# Patient Record
Sex: Female | Born: 1945
Health system: Southern US, Community
[De-identification: ages and names within clinical notes are randomized; demographics above are authoritative.]

## PROBLEM LIST (undated history)

## (undated) DIAGNOSIS — I10 Essential (primary) hypertension: Secondary | ICD-10-CM

## (undated) DIAGNOSIS — M1990 Unspecified osteoarthritis, unspecified site: Secondary | ICD-10-CM

## (undated) DIAGNOSIS — K5790 Diverticulosis of intestine, part unspecified, without perforation or abscess without bleeding: Secondary | ICD-10-CM

## (undated) DIAGNOSIS — K219 Gastro-esophageal reflux disease without esophagitis: Secondary | ICD-10-CM

## (undated) DIAGNOSIS — T7840XA Allergy, unspecified, initial encounter: Secondary | ICD-10-CM

## (undated) DIAGNOSIS — K648 Other hemorrhoids: Secondary | ICD-10-CM

## (undated) DIAGNOSIS — D126 Benign neoplasm of colon, unspecified: Secondary | ICD-10-CM

## (undated) DIAGNOSIS — M8580 Other specified disorders of bone density and structure, unspecified site: Secondary | ICD-10-CM

## (undated) HISTORY — PX: TONSILLECTOMY AND ADENOIDECTOMY: SUR1326

## (undated) HISTORY — DX: Allergy, unspecified, initial encounter: T78.40XA

## (undated) HISTORY — DX: Benign neoplasm of colon, unspecified: D12.6

## (undated) HISTORY — DX: Diverticulosis of intestine, part unspecified, without perforation or abscess without bleeding: K57.90

## (undated) HISTORY — PX: ROTATOR CUFF REPAIR: SHX139

## (undated) HISTORY — DX: Essential (primary) hypertension: I10

## (undated) HISTORY — DX: Gastro-esophageal reflux disease without esophagitis: K21.9

## (undated) HISTORY — DX: Unspecified osteoarthritis, unspecified site: M19.90

## (undated) HISTORY — DX: Other hemorrhoids: K64.8

## (undated) HISTORY — PX: APPENDECTOMY: SHX54

## (undated) HISTORY — DX: Other specified disorders of bone density and structure, unspecified site: M85.80

---

## 1999-05-04 ENCOUNTER — Ambulatory Visit (HOSPITAL_COMMUNITY): Admission: RE | Admit: 1999-05-04 | Discharge: 1999-05-04 | Payer: Self-pay | Admitting: Gastroenterology

## 1999-12-12 ENCOUNTER — Encounter: Payer: Self-pay | Admitting: Internal Medicine

## 1999-12-12 ENCOUNTER — Encounter: Admission: RE | Admit: 1999-12-12 | Discharge: 1999-12-12 | Payer: Self-pay | Admitting: Internal Medicine

## 2000-09-30 ENCOUNTER — Other Ambulatory Visit: Admission: RE | Admit: 2000-09-30 | Discharge: 2000-09-30 | Payer: Self-pay | Admitting: *Deleted

## 2001-01-01 ENCOUNTER — Encounter: Payer: Self-pay | Admitting: *Deleted

## 2001-01-01 ENCOUNTER — Encounter: Admission: RE | Admit: 2001-01-01 | Discharge: 2001-01-01 | Payer: Self-pay | Admitting: *Deleted

## 2002-01-14 ENCOUNTER — Encounter: Admission: RE | Admit: 2002-01-14 | Discharge: 2002-01-14 | Payer: Self-pay | Admitting: Internal Medicine

## 2002-01-14 ENCOUNTER — Encounter: Payer: Self-pay | Admitting: Internal Medicine

## 2002-03-05 ENCOUNTER — Other Ambulatory Visit: Admission: RE | Admit: 2002-03-05 | Discharge: 2002-03-05 | Payer: Self-pay | Admitting: *Deleted

## 2003-01-28 ENCOUNTER — Encounter: Payer: Self-pay | Admitting: Internal Medicine

## 2003-01-28 ENCOUNTER — Ambulatory Visit (HOSPITAL_COMMUNITY): Admission: RE | Admit: 2003-01-28 | Discharge: 2003-01-28 | Payer: Self-pay | Admitting: Internal Medicine

## 2004-03-14 ENCOUNTER — Ambulatory Visit (HOSPITAL_COMMUNITY): Admission: RE | Admit: 2004-03-14 | Discharge: 2004-03-14 | Payer: Self-pay | Admitting: Internal Medicine

## 2005-02-26 ENCOUNTER — Encounter: Admission: RE | Admit: 2005-02-26 | Discharge: 2005-02-26 | Payer: Self-pay | Admitting: Internal Medicine

## 2005-05-08 ENCOUNTER — Ambulatory Visit (HOSPITAL_COMMUNITY): Admission: RE | Admit: 2005-05-08 | Discharge: 2005-05-08 | Payer: Self-pay | Admitting: Internal Medicine

## 2006-05-21 ENCOUNTER — Ambulatory Visit (HOSPITAL_COMMUNITY): Admission: RE | Admit: 2006-05-21 | Discharge: 2006-05-21 | Payer: Self-pay | Admitting: Internal Medicine

## 2007-06-26 ENCOUNTER — Ambulatory Visit (HOSPITAL_COMMUNITY): Admission: RE | Admit: 2007-06-26 | Discharge: 2007-06-26 | Payer: Self-pay | Admitting: Internal Medicine

## 2007-07-01 ENCOUNTER — Encounter: Admission: RE | Admit: 2007-07-01 | Discharge: 2007-07-01 | Payer: Self-pay | Admitting: Internal Medicine

## 2008-05-11 ENCOUNTER — Ambulatory Visit (HOSPITAL_BASED_OUTPATIENT_CLINIC_OR_DEPARTMENT_OTHER): Admission: RE | Admit: 2008-05-11 | Discharge: 2008-05-12 | Payer: Self-pay | Admitting: Orthopedic Surgery

## 2008-07-12 ENCOUNTER — Ambulatory Visit (HOSPITAL_COMMUNITY): Admission: RE | Admit: 2008-07-12 | Discharge: 2008-07-12 | Payer: Self-pay | Admitting: Family Medicine

## 2008-08-23 ENCOUNTER — Ambulatory Visit (HOSPITAL_COMMUNITY): Admission: RE | Admit: 2008-08-23 | Discharge: 2008-08-23 | Payer: Self-pay | Admitting: Internal Medicine

## 2009-09-01 ENCOUNTER — Ambulatory Visit (HOSPITAL_COMMUNITY): Admission: RE | Admit: 2009-09-01 | Discharge: 2009-09-01 | Payer: Self-pay | Admitting: Internal Medicine

## 2010-12-16 ENCOUNTER — Encounter: Payer: Self-pay | Admitting: Internal Medicine

## 2011-04-10 NOTE — Op Note (Signed)
NAMEPRINCESSA, LESMEISTER                ACCOUNT NO.:  [id number]   MEDICAL RECORD NO.:  [id number]          PATIENT TYPE:  AMB   LOCATION:  [facility]                         FACILITY:  [facility]   PHYSICIAN:  Deidre Ala, M.D.    DATE OF BIRTH:  Jul 27, 1946   DATE OF PROCEDURE:  05/11/2008  DATE OF DISCHARGE:                               OPERATIVE REPORT   PREOPERATIVE DIAGNOSES:  1. Incomplete avulsion of subscapularis tendon, anterior right      shoulder closed.  2. Subluxation of the long head biceps tendon.   POSTOPERATIVE DIAGNOSES:  1. Incomplete avulsion of subscapularis tendon, anterior right      shoulder closed.  2. Subluxation of the long head biceps tendon.   PROCEDURE:  1. Open reduction of subscapularis rotator cuff anteriorly with use of      3 Bio-Corkscrew anchors 5.5.  2. A long head biceps tenodesis using push-lock devices Arthrex x3.   SURGEON:  Doristine Section.   ASSISTANT:  Phineas Semen, P.A.-C.   ANESTHESIA:  General endotracheal with scalene block.   CULTURES:  None.   DRAINS:  None.   BLOOD LOSS:  Less than 100 mL, replaced without.   PATHOLOGIC FINDINGS AND HISTORY:  Charlotte Williams is a 65 year old who had a low-  velocity injury producing shoulder pain.  The MRI was positive for the  above findings.  We discussed it and felt that this would produce a  significant deficit for her with a recurring clicking of the biceps  tendon.  At surgery, we did find complete subluxation of the biceps  tendon with hypertrophic synovium within the bicipital groove.  There  was synovium that was hypertrophied in the inner capsule.  The labrum  was intact as well as the glenohumeral surface.  The biceps rupture was  ___________ and we were able to settle it down nicely into the footprint  just medial to the lesser tuberosity with 3 bio-Corkscrew anchors with 6  total sutures taken through with horizontal mattress sutures down the  line and then brought over top of the lesser  tuberosity into the groove,  and using the sutures from front to back down with push locks x3, each  with two sutures in them with four strands with the Arthrex push locks,  this tenodesed the biceps into the inner tubercular groove and flattened  down the knots and flattened down the repair of the subscapularis into  the footprint.  We also put two stitches of #2 Ethilon in the more  distal rotator cuff interval for complete closure.   PROCEDURE:  With adequate anesthesia obtained using general endotracheal  and scalene block, the patient was placed in supine beach chair  position.  Skin markings were made for anatomic positioning, and  standard prepping and draping had been carried out prior to this.  We  then made an incision in the deltopectoral groove.  Incision was  deepened sharply with a knife and hemostasis obtained using Bovie  electrocoagulator.  Under loupe magnification with headlamp, dissection  was carried down through the subcutaneous tissue into the deltopectoral  interval.  We then identified and protected the cephalic vein.  We then  dissected down into the precoracoid fascia and released the lateral  conjoined tendon to gain better access.  Deep link retractors were then  placed exposing the anterior joint with visible some fluid coming from  the area that was some murky but obviously not infected from the area of  the retracted subscapularis off its bed just medial to the lesser  tuberosity.  The biceps tendon was seen to be subluxed.  We then removed  sharply hypertrophic synovium within the groove and in the joint  posterior to the subscapularis, checked the labrum.  I then cut the long  head of the biceps at the superior labrum and tagged it so it would not  retract.  I then placed 3 bio-Corkscrew anchors from top to bottom along  the lesser tuberosity, 5.5, with the 2 FiberWire sutures #2 each,  brought them underneath, and then through with horizontal mattress   technique and then sutured each suture down for a total 6 horizontal  mattress sutures reconstituting the subscapularis back to its footprint  for the rupture.  I then took the knots and placed them from anterior to  posterior, and then the upper 2, middle 2 and the lower 2 each had the  suture knots placed into the groove using push locks, thus completing  biceps tenodesis as well as flattening down the knots and applying the  sutures against the lesser tuberosity as one would do in a rotator cuff  repair.  We then repaired the rotator cuff interval as listed.  Thorough  irrigation was carried out.  A second gram of Ancef was given later in  the case due to the length of the procedure.  I then closed the release  of the lateral conjoined tendon with #1 Ethibond and then closed the  deep subcutaneous with a running 2-0 Vicryl, superficial 3-0 Vicryl and  running 3-0 Monocryl with Steri-Strips.  Bulky sterile compressive  dressing was applied with a sling.  The patient having tolerated the  procedure well was awakened, taken to recovery room in satisfactory  condition, to be discharged per outpatient routine, given Percocet for  pain and told call the office for appointment for recheck on Friday or  Saturday.           ______________________________  V. Charlesetta Shanks, M.D.     VEP/MEDQ  D:  05/11/2008  T:  05/11/2008  Job:  [id number]   cc:   Deidre Ala, M.D.  Fax: [phone]   Theressa Millard, M.D.  Fax: [phone]

## 2011-08-23 LAB — POCT HEMOGLOBIN-HEMACUE: Operator id: 114531

## 2012-05-08 ENCOUNTER — Telehealth: Payer: Self-pay

## 2012-05-08 NOTE — Telephone Encounter (Signed)
PT STATES SHE IS FILLING OUT A [employer] INSURANCE AND NEED TO KNOW WHEN DID SHE HAVE BRONCHITIS AND SINUSES PLEASE CALL [phone]. PT WAS TOLD IT COULD TAKE UP TO 48HRS.

## 2016-07-19 LAB — PULMONARY FUNCTION TEST

## 2017-08-16 ENCOUNTER — Ambulatory Visit: Payer: Self-pay | Admitting: Family Medicine

## 2017-08-17 ENCOUNTER — Ambulatory Visit (INDEPENDENT_AMBULATORY_CARE_PROVIDER_SITE_OTHER): Payer: Medicare PPO | Admitting: Physician Assistant

## 2017-08-17 ENCOUNTER — Encounter: Payer: Self-pay | Admitting: Physician Assistant

## 2017-08-17 VITALS — BP 144/91 | HR 95 | Temp 98.1°F | Resp 17 | Ht 62.0 in | Wt 109.0 lb

## 2017-08-17 DIAGNOSIS — K529 Noninfective gastroenteritis and colitis, unspecified: Secondary | ICD-10-CM | POA: Diagnosis not present

## 2017-08-17 DIAGNOSIS — R1084 Generalized abdominal pain: Secondary | ICD-10-CM | POA: Diagnosis not present

## 2017-08-17 LAB — POCT CBC
Granulocyte percent: 67.3 %G (ref 37–80)
HCT, POC: 44.1 % (ref 37.7–47.9)
Hemoglobin: 15.2 g/dL (ref 12.2–16.2)
Lymph, poc: 2.4 (ref 0.6–3.4)
MCH, POC: 31.6 pg — AB (ref 27–31.2)
MCHC: 34.4 g/dL (ref 31.8–35.4)
MCV: 91.9 fL (ref 80–97)
MID (cbc): 0.6 (ref 0–0.9)
MPV: 7 fL (ref 0–99.8)
POC Granulocyte: 6.1 (ref 2–6.9)
POC LYMPH PERCENT: 25.9 %L (ref 10–50)
POC MID %: 6.8 % (ref 0–12)
Platelet Count, POC: 284 10*3/uL (ref 142–424)
RBC: 4.8 M/uL (ref 4.04–5.48)
RDW, POC: 12.4 %
WBC: 9.1 10*3/uL (ref 4.6–10.2)

## 2017-08-17 LAB — POCT URINALYSIS DIP (MANUAL ENTRY)
Glucose, UA: NEGATIVE mg/dL
Leukocytes, UA: NEGATIVE
Nitrite, UA: NEGATIVE
Protein Ur, POC: 30 mg/dL — AB
Spec Grav, UA: 1.01 (ref 1.010–1.025)
Urobilinogen, UA: 0.2 E.U./dL
pH, UA: 7 (ref 5.0–8.0)

## 2017-08-17 LAB — POC MICROSCOPIC URINALYSIS (UMFC): Mucus: ABSENT

## 2017-08-17 NOTE — Progress Notes (Signed)
Charlotte Williams  MRN: [id number] DOB: 1946/06/03  PCP: No primary care provider on file.  Subjective:  Pt is a 71 year old female who presents to clinic for abdominal pain x 3 days. Endorses vomiting and not able to keep food down.  4 episodes of vomiting three days ago; 2 episodes the next day; none since that time. She has had a few episodes of diarrhea over the past few days. Diarrhea is "kind of watery to normal". She endorses crampy diffuse abdominal pain. No blood in stool or vomit. Denies fever, chills.  She has been able to eat 2 bananas, a few bites of soup, some bread, water, orange juice, coffee.  She went out to eat with a friend five days ago. Her symptoms started the next day. No travel.   Review of Systems  Constitutional: Negative for chills, diaphoresis, fatigue and fever.  Gastrointestinal: Positive for abdominal pain, diarrhea, nausea and vomiting. Negative for blood in stool and constipation.  Neurological: Negative for weakness, light-headedness and headaches.  Psychiatric/Behavioral: Negative for sleep disturbance.    There are no active problems to display for this patient.   No current outpatient prescriptions on file prior to visit.   No current facility-administered medications on file prior to visit.     Allergies  Allergen Reactions  . Codeine Itching  . Sulfa Antibiotics Itching     Objective:  BP (!) 144/91 (BP Location: Right Arm, Patient Position: Sitting, Cuff Size: Normal)   Pulse 95   Temp 98.1 F (36.7 C) (Oral)   Resp 17   Ht '5\' 2"'  (1.575 m)   Wt 109 lb (49.4 kg)   SpO2 99%   BMI 19.94 kg/m   Physical Exam  Constitutional: She is oriented to person, place, and time and well-developed, well-nourished, and in no distress. No distress.  Cardiovascular: Normal rate, regular rhythm and normal heart sounds.   Abdominal: Soft. Normal appearance and bowel sounds are normal. There is generalized tenderness. There is no rigidity, no rebound  and no guarding.  Neurological: She is alert and oriented to person, place, and time. GCS score is 15.  Skin: Skin is warm and dry.  Psychiatric: Mood, memory, affect and judgment normal.  Vitals reviewed.  Results for orders placed or performed in visit on 08/17/17  POCT CBC  Result Value Ref Range   WBC 9.1 4.6 - 10.2 K/uL   Lymph, poc 2.4 0.6 - 3.4   POC LYMPH PERCENT 25.9 10 - 50 %L   MID (cbc) 0.6 0 - 0.9   POC MID % 6.8 0 - 12 %M   POC Granulocyte 6.1 2 - 6.9   Granulocyte percent 67.3 37 - 80 %G   RBC 4.80 4.04 - 5.48 M/uL   Hemoglobin 15.2 12.2 - 16.2 g/dL   HCT, POC 44.1 37.7 - 47.9 %   MCV 91.9 80 - 97 fL   MCH, POC 31.6 (A) 27 - 31.2 pg   MCHC 34.4 31.8 - 35.4 g/dL   RDW, POC 12.4 %   Platelet Count, POC 284 142 - 424 K/uL   MPV 7.0 0 - 99.8 fL  POCT urinalysis dipstick  Result Value Ref Range   Color, UA yellow yellow   Clarity, UA clear clear   Glucose, UA negative negative mg/dL   Bilirubin, UA small (A) negative   Ketones, POC UA trace (5) (A) negative mg/dL   Spec Grav, UA 1.010 1.010 - 1.025   Blood, UA moderate (A)  negative   pH, UA 7.0 5.0 - 8.0   Protein Ur, POC =30 (A) negative mg/dL   Urobilinogen, UA 0.2 0.2 or 1.0 E.U./dL   Nitrite, UA Negative Negative   Leukocytes, UA Negative Negative  POCT Microscopic Urinalysis ([facility])  Result Value Ref Range   WBC,UR,HPF,POC None None WBC/hpf   RBC,UR,HPF,POC Few (A) None RBC/hpf   Bacteria None None, Too numerous to count   Mucus Absent Absent   Epithelial Cells, UR Per Microscopy Few (A) None, Too numerous to count cells/hpf    Assessment and Plan :  1. Generalized abdominal pain 2. Gastroenteritis - POCT CBC - CMP14+EGFR - POCT urinalysis dipstick - POCT Microscopic Urinalysis ([facility]) - Labs are negative. Vital signs are wnl. Her symptoms are improving. Suspect gastroenteritis. Advised bland diet and hydration until symptoms resolve. RTC if she fails to improve/symptoms worsen. She understands and  agrees with plan.   Mercer Pod, PA-C  [facility] [place] 08/17/2017 8:45 AM

## 2017-08-17 NOTE — Patient Instructions (Addendum)
Start following the bland diet (below). Stay well hydrated!! Drink 32-64 oz water and/or clear fluids daily. Follow this diet until your symptoms improve. You will be feeling an upset stomach for about 1 week.  Come back if you are not better in 5 days.    Bland Diet A bland diet consists of foods that do not have a lot of fat or fiber. Foods without fat or fiber are easier for the body to digest. They are also less likely to irritate your mouth, throat, stomach, and other parts of your gastrointestinal tract. A bland diet is sometimes called a BRAT diet. What is my plan? Your health care provider or dietitian may recommend specific changes to your diet to prevent and treat your symptoms, such as:  Eating small meals often.  Cooking food until it is soft enough to chew easily.  Chewing your food well.  Drinking fluids slowly.  Not eating foods that are very spicy, sour, or fatty.  Not eating citrus fruits, such as oranges and grapefruit.  What do I need to know about this diet?  Eat a variety of foods from the bland diet food list.  Do not follow a bland diet longer than you have to.  Ask your health care provider whether you should take vitamins. What foods can I eat? Grains  Hot cereals, such as cream of wheat. Bread, crackers, or tortillas made from refined white flour. Rice. Vegetables Canned or cooked vegetables. Mashed or boiled potatoes. Fruits Bananas. Applesauce. Other types of cooked or canned fruit with the skin and seeds removed, such as canned peaches or pears. Meats and Other Protein Sources Scrambled eggs. Creamy peanut butter or other nut butters. Lean, well-cooked meats, such as chicken or fish. Tofu. Soups or broths. Dairy Low-fat dairy products, such as milk, cottage cheese, or yogurt. Beverages Water. Herbal tea. Apple juice. Sweets and Desserts Pudding. Custard. Fruit gelatin. Ice cream. Fats and Oils Mild salad dressings. Canola or olive oil. The  items listed above may not be a complete list of allowed foods or beverages. Contact your dietitian for more options. What foods are not recommended? Foods and ingredients that are often not recommended include:  Spicy foods, such as hot sauce or salsa.  Fried foods.  Sour foods, such as pickled or fermented foods.  Raw vegetables or fruits, especially citrus or berries.  Caffeinated drinks.  Alcohol.  Strongly flavored seasonings or condiments.  The items listed above may not be a complete list of foods and beverages that are not allowed. Contact your dietitian for more information. This information is not intended to replace advice given to you by your health care provider. Make sure you discuss any questions you have with your health care provider. Document Released: 03/05/2016 Document Revised: 04/19/2016 Document Reviewed: 11/24/2014 Elsevier Interactive Patient Education  2018 [employer].   IF you received an x-ray today, you will receive an invoice from [facility]. Please contact [facility] at [phone] with questions or concerns regarding your invoice.   IF you received labwork today, you will receive an invoice from [place]. Please contact [facility] at [phone] with questions or concerns regarding your invoice.   Our billing staff will not be able to assist you with questions regarding bills from these companies.  You will be contacted with the lab results as soon as they are available. The fastest way to get your results is to activate your My Chart account. Instructions are located on the last page of this paperwork.  If you have not heard from [place] regarding the results in 2 weeks, please contact this office.

## 2017-08-18 LAB — CMP14+EGFR
ALT: 22 IU/L (ref 0–32)
AST: 28 IU/L (ref 0–40)
Albumin/Globulin Ratio: 2.8 — ABNORMAL HIGH (ref 1.2–2.2)
Albumin: 5.1 g/dL — ABNORMAL HIGH (ref 3.6–4.8)
Alkaline Phosphatase: 46 IU/L (ref 39–117)
BUN/Creatinine Ratio: 9 — ABNORMAL LOW (ref 12–28)
BUN: 6 mg/dL — ABNORMAL LOW (ref 8–27)
Bilirubin Total: 0.4 mg/dL (ref 0.0–1.2)
CO2: 25 mmol/L (ref 20–29)
Calcium: 9.7 mg/dL (ref 8.7–10.3)
Chloride: 90 mmol/L — ABNORMAL LOW (ref 96–106)
Creatinine, Ser: 0.66 mg/dL (ref 0.57–1.00)
GFR calc Af Amer: 104 mL/min/{1.73_m2} (ref >59)
GFR calc non Af Amer: 90 mL/min/{1.73_m2} (ref >59)
Globulin, Total: 1.8 g/dL (ref 1.5–4.5)
Glucose: 88 mg/dL (ref 65–99)
Potassium: 3.8 mmol/L (ref 3.5–5.2)
Sodium: 132 mmol/L — ABNORMAL LOW (ref 134–144)
Total Protein: 6.9 g/dL (ref 6.0–8.5)

## 2017-08-20 ENCOUNTER — Ambulatory Visit (INDEPENDENT_AMBULATORY_CARE_PROVIDER_SITE_OTHER): Payer: Medicare PPO | Admitting: Physician Assistant

## 2017-08-20 ENCOUNTER — Encounter: Payer: Self-pay | Admitting: Physician Assistant

## 2017-08-20 VITALS — BP 121/85 | HR 71 | Temp 98.4°F | Resp 18 | Ht 62.0 in | Wt 110.6 lb

## 2017-08-20 DIAGNOSIS — Z23 Encounter for immunization: Secondary | ICD-10-CM

## 2017-08-20 DIAGNOSIS — G4700 Insomnia, unspecified: Secondary | ICD-10-CM | POA: Insufficient documentation

## 2017-08-20 DIAGNOSIS — H43391 Other vitreous opacities, right eye: Secondary | ICD-10-CM | POA: Diagnosis not present

## 2017-08-20 DIAGNOSIS — H2513 Age-related nuclear cataract, bilateral: Secondary | ICD-10-CM | POA: Diagnosis not present

## 2017-08-20 DIAGNOSIS — R1011 Right upper quadrant pain: Secondary | ICD-10-CM | POA: Diagnosis not present

## 2017-08-20 MED ORDER — LORAZEPAM 0.5 MG PO TABS: 0.5000 mg | ORAL_TABLET | Freq: Every evening | ORAL | 0 refills | Status: DC | PRN

## 2017-08-20 NOTE — Patient Instructions (Addendum)
We recommend that you schedule a mammogram for breast cancer screening. Typically, you do not need a referral to do this. Please contact a local imaging center to schedule your mammogram.  [facility] - [phone]  *ask for the Radiology Department [facility] - [phone] or [phone]  [facility] - [phone] [facility] - [phone] [facility] - [phone]  *ask for the Radiology Department [facility] - [phone]  *ask for the Radiology Department [facility] - [phone]  *ask for the Mammography Department [facility] - [phone]     IF you received an x-ray today, you will receive an invoice from [facility]. Please contact [facility] at [phone] with questions or concerns regarding your invoice.   IF you received labwork today, you will receive an invoice from [facility]. Please contact [facility] at [phone] with questions or concerns regarding your invoice.   Our billing staff will not be able to assist you with questions regarding bills from these companies.  You will be contacted with the lab results as soon as they are available. The fastest way to get your results is to activate your My Chart account. Instructions are located on the last page of this paperwork. If you have not heard from us regarding the results in 2 weeks, please contact this office.      

## 2017-08-20 NOTE — Assessment & Plan Note (Signed)
Recent evaluation was unremarkable. Considering GB diease as cause for her episodic symptoms. RUQ [place]. If negative, plan referral to GI.

## 2017-08-20 NOTE — Progress Notes (Signed)
Patient ID: Charlotte Williams, female    DOB: March 15, 1946, 71 y.o.   MRN: [id number]  PCP: Patient, No Pcp Per  Chief Complaint  Patient presents with  . Abdominal Pain    Pt states the pain is gone and is no longer vomiting. Pt states pain went away yesterday afternoon.  . Follow-up    Subjective:   Presents for evaluation of RUQ abdominal pain and nausea. She was seen on 9/22 by my colleague.  I've had bouts with my stomach for years. I have always had gas. I can out belch anyone." Never bad enough to go to the doctor, and never with vomiting.  This episode began with heartburn, which she has not had in years. Progressive worsening to the point that she vomited x 4 in one day, then twice the following day Developed a firm area in the RUQ, and decided that it was a pulled muscle from vomiting. Soreness in the RIGHT abdomen and chest wall.  Was seen here 08/17/2017. Evaluation was unremarkable, thought gastroenteritis was thought most likely. Went home, really hungry, and ate a meal-"like a switch," all her symptoms resolved.  "It's like I have a blockage. Like something gets twisted, and then once it untwists, it goes away."  Saw GI in [place], where she lived for 7 years, who advised she come in during an episode, but she returned to [place] before she had another episode. Has not had an upper endoscopy. Is in the process of establishing with providers again locally.  Review of Systems As above.    There are no active problems to display for this patient.    Prior to Admission medications   Not on File     Allergies  Allergen Reactions  . Codeine Itching  . Sulfa Antibiotics Itching       Objective:  Physical Exam  Constitutional: She is oriented to person, place, and time. She appears well-developed and well-nourished. She is active and cooperative. No distress.  BP 121/85 (BP Location: Right Arm, Patient Position: Sitting, Cuff Size: Normal)   Pulse  71   Temp 98.4 F (36.9 C) (Oral)   Resp 18   Ht 5\' 2"  (1.575 m)   Wt 110 lb 9.6 oz (50.2 kg)   SpO2 96%   BMI 20.23 kg/m   HENT:  Head: Normocephalic and atraumatic.  Right Ear: Hearing normal.  Left Ear: Hearing normal.  Eyes: Conjunctivae are normal. No scleral icterus.  Neck: Normal range of motion. Neck supple. No thyromegaly present.  Cardiovascular: Normal rate, regular rhythm and normal heart sounds.   Pulses:      Radial pulses are 2+ on the right side, and 2+ on the left side.  Pulmonary/Chest: Effort normal and breath sounds normal.  Lymphadenopathy:       Head (right side): No tonsillar, no preauricular, no posterior auricular and no occipital adenopathy present.       Head (left side): No tonsillar, no preauricular, no posterior auricular and no occipital adenopathy present.    She has no cervical adenopathy.       Right: No supraclavicular adenopathy present.       Left: No supraclavicular adenopathy present.  Neurological: She is alert and oriented to person, place, and time. No sensory deficit.  Skin: Skin is warm, dry and intact. No rash noted. No cyanosis or erythema. Nails show no clubbing.  Psychiatric: She has a normal mood and affect. Her speech is normal and behavior is normal.  Assessment & Plan:   Problem List Items Addressed This Visit    Colicky RUQ abdominal pain - Primary    Recent evaluation was unremarkable. Considering GB diease as cause for her episodic symptoms. RUQ [place]. If negative, plan referral to GI.      Relevant Orders   US Abdomen Limited RUQ   Insomnia    Stable. Continue PRN benzodiazepine.      Relevant Medications   LORazepam (ATIVAN) 0.5 MG tablet    Other Visit Diagnoses    Need for influenza vaccination       Relevant Orders   Flu Vaccine QUAD 36+ mos IM (Completed)       Return if symptoms worsen or fail to improve.   Fara Chute, PA-C [facility] at [place]

## 2017-08-20 NOTE — Assessment & Plan Note (Signed)
Stable. Continue PRN benzodiazepine.

## 2017-08-30 ENCOUNTER — Ambulatory Visit
Admission: RE | Admit: 2017-08-30 | Discharge: 2017-08-30 | Disposition: A | Discharge status: Home / Self Care | Payer: Medicare PPO | Source: Ambulatory Visit | Attending: Physician Assistant | Admitting: Physician Assistant

## 2017-08-30 DIAGNOSIS — K7689 Other specified diseases of liver: Secondary | ICD-10-CM | POA: Diagnosis not present

## 2017-08-30 DIAGNOSIS — R1011 Right upper quadrant pain: Secondary | ICD-10-CM

## 2017-09-12 ENCOUNTER — Telehealth: Payer: Self-pay | Admitting: Family Medicine

## 2017-09-12 NOTE — Telephone Encounter (Signed)
Charlotte Williams CALLED TO LET YOU KNOW THAT SHE DOESN'T WANT TO DO ANYMORE TESTING UNTIL SHE HAVE OR IF SHE HAVE ANY MORE PROBLEMS WITH HER STOMACH

## 2017-09-12 NOTE — Telephone Encounter (Signed)
See note below

## 2017-09-13 NOTE — Telephone Encounter (Signed)
Noted  

## 2017-10-07 ENCOUNTER — Telehealth: Payer: Self-pay | Admitting: Physician Assistant

## 2017-10-07 NOTE — Telephone Encounter (Signed)
Copied from [place] #[id number]. Topic: Quick Communication - See Telephone Encounter >> Oct 07, 2017  8:57 AM Burnis Medin, NT wrote: CRM for notification. See Telephone encounter for: Pt. Called about getting a prescription for conjunctivitis. Pt. Said her eyes are red and has been this way for several days and is very itchy. Pt. Uses [facility] in [facility].  10/07/17.

## 2017-10-08 NOTE — Telephone Encounter (Signed)
Informed patient to make an appointment she states she is going to wait to see if maybe it was her makeup since she just changed it.   Advised to come in if problem consist

## 2017-10-09 ENCOUNTER — Encounter: Payer: Self-pay | Admitting: Physician Assistant

## 2017-10-09 ENCOUNTER — Ambulatory Visit: Payer: Medicare PPO | Admitting: Physician Assistant

## 2017-10-09 VITALS — BP 118/80 | HR 94 | Temp 98.1°F | Resp 16 | Ht 62.25 in | Wt 114.6 lb

## 2017-10-09 DIAGNOSIS — H1011 Acute atopic conjunctivitis, right eye: Secondary | ICD-10-CM

## 2017-10-09 MED ORDER — NAPHAZOLINE-PHENIRAMINE 0.027-0.315 % OP SOLN: 1.0000 [drp] | Freq: Four times a day (QID) | OPHTHALMIC | 0 refills | Status: DC | PRN

## 2017-10-09 NOTE — Progress Notes (Signed)
   Charlotte Williams  MRN: [id number] DOB: [birth year]  PCP: Harrison Mons, PA-C  Subjective:  Pt is a 71 year old female who presents to clinic for eye redness x 4 days. She switched her mascara 2 days ago and has been improving since then.  C/o clear discharge Denies FB sensation, photophobia, surrounding redness or pain  Review of Systems  Constitutional: Negative for chills and fever.  Eyes: Positive for discharge, redness and itching. Negative for photophobia, pain and visual disturbance.    Patient Active Problem List   Diagnosis Date Noted  . Colicky RUQ abdominal pain 08/20/2017  . Insomnia 08/20/2017    Current Outpatient Medications on File Prior to Visit  Medication Sig Dispense Refill  . LORazepam (ATIVAN) 0.5 MG tablet Take 1 tablet (0.5 mg total) by mouth at bedtime as needed for anxiety or sleep. 30 tablet 0   No current facility-administered medications on file prior to visit.     Allergies  Allergen Reactions  . Codeine Itching  . Sulfa Antibiotics Itching     Objective:  BP 118/80   Pulse 94   Temp 98.1 F (36.7 C) (Oral)   Resp 16   Ht 5' 2.25" (1.581 m)   Wt 114 lb 9.6 oz (52 kg)   SpO2 98%   BMI 20.79 kg/m   Physical Exam  Constitutional: She is oriented to person, place, and time and well-developed, well-nourished, and in no distress. No distress.  Eyes: Conjunctivae, EOM and lids are normal. Pupils are equal, round, and reactive to light.  Cardiovascular: Normal rate, regular rhythm and normal heart sounds.  Neurological: She is alert and oriented to person, place, and time. GCS score is 15.  Skin: Skin is warm and dry.  Psychiatric: Mood, memory, affect and judgment normal.  Vitals reviewed.   Assessment and Plan :  1. Allergic conjunctivitis of right eye - Pt c/o eye redness x 4 days, which is improving since changing make-up. Suspect allergic conjunctivitis. No red flag s/s. No concerning findings on PE. Fluorescein eye exam deferred.  Advised antihistamine eye drops. RTC if no improvement.    Mercer Pod, PA-C  [facility] at [place] 10/09/2017 3:59 PM

## 2017-10-22 ENCOUNTER — Ambulatory Visit (INDEPENDENT_AMBULATORY_CARE_PROVIDER_SITE_OTHER): Payer: Medicare PPO | Admitting: Physician Assistant

## 2017-10-22 ENCOUNTER — Encounter: Payer: Self-pay | Admitting: Physician Assistant

## 2017-10-22 VITALS — BP 126/82 | HR 86 | Temp 98.4°F | Resp 18 | Ht 62.25 in | Wt 114.8 lb

## 2017-10-22 DIAGNOSIS — Z0000 Encounter for general adult medical examination without abnormal findings: Secondary | ICD-10-CM

## 2017-10-22 NOTE — Progress Notes (Signed)
Presents today for [employer] Visit-Subsequent.   Date of last exam: November 2017 in [place], [place]  Interpreter used for this visit? no  Patient Care Team: Charlotte Mons, PA-C as PCP - General (Family Medicine)   Other items to address today:  -We need to get her records from her previous provider. -Has had Zostavax, and one pneumococcal vaccine. -Bunions, s/p surgery LEFT, no improvement; wears a toe spacer between the great and second toes. Feet feel stiff, not painful. Declines referral to podiatry. -Dry eyes. Red and crusting in the mornings.  Cancer Screening: Cervical: n/a, no longer a candidate s/p hysterectomy due to endometriosis in 1983 Breast: 09/2016 in [place], [place] Colon: not in the past 7 years, Dr. Doreene Adas performed the last one.  Prostate: n/a   Other Screening: Last screening for diabetes: >12 months Last lipid screening: elevated lipids at life insurance physical due to high HDL  ADVANCE DIRECTIVES: Discussed: "It's all part of my will." Patient desires CPR (Yes ), mechanical ventilation (No ), prolonged artificial support (may include mechanical ventilation, tube/PEG feeding, etc) (No ). On File: no Materials Provided: yes  Immunization status:  Immunization History  Administered Date(s) Administered  . Influenza,inj,Quad PF,6+ Mos 08/20/2017     Health Maintenance Due  Topic Date Due  . Hepatitis C Screening  1946/03/25  . TETANUS/TDAP  09/03/1966  . COLONOSCOPY  09/03/1997  . PNA vac Low Risk Adult (1 of 2 - PCV13) 09/03/2012     Functional Status Survey: Is the patient deaf or have difficulty hearing?: No Does the patient have difficulty seeing, even when wearing glasses/contacts?: No Does the patient have difficulty concentrating, remembering, or making decisions?: No Does the patient have difficulty walking or climbing stairs?: No Does the patient have difficulty dressing or bathing?: No Does the patient  have difficulty doing errands alone such as visiting a doctor's office or shopping?: No Clinical Intake - 10/22/17 0919      Nutrition Screen   Diabetes  No      Functional Status   Activities of Daily Living  Independent    Ambulation  Independent    Medication Administration  Independent    Home Management  Independent      Risk/Barriers   Barriers to Care Management & Learning  None      Abuse/Neglect   Do you feel unsafe in your current relationship?  No    Do you feel physically threatened by others?  No    Anyone hurting you at home, work, or school?  No    Unable to ask?  No    Information provided on Community resources  No      Patient Literacy   How often do you need to have someone help you when you read instructions, pamphlets, or other written materials from your doctor or pharmacy?  1 - Never    What is the last grade level you completed in school?  College Neurosurgeon Needed?  No      Comments   Information entered by :  Charlotte Mons, PA-C       Home Environment: 2 story home, with her cats. Stairs to enter the home 4+2, 15 between stories, bedroom is upstairs.  Urinary Incontinence Screening: every now and then-stress incontinence, occasional incomplete emptying.  Patient Active Problem List   Diagnosis Date Noted  . Colicky RUQ abdominal pain 08/20/2017  . Insomnia 08/20/2017  Past Medical History:  Diagnosis Date  . Arthritis      Past Surgical History:  Procedure Laterality Date  . APPENDECTOMY       Family History  Problem Relation Age of Onset  . Endometriosis Daughter   . Dementia Mother   . Endometriosis Mother   . Heart disease Father   . Stroke Father      Social History   Socioeconomic History  . Marital status: Divorced    Spouse name: Not on file  . Number of children: 2  . Years of education: Not on file  . Highest education level: Bachelor's degree (e.g., BA, AB, BS)  Social  Needs  . Financial resource strain: Not hard at all  . Food insecurity - worry: Never true  . Food insecurity - inability: Never true  . Transportation needs - medical: No  . Transportation needs - non-medical: No  Occupational History  . Occupation: Realtor  Tobacco Use  . Smoking status: Never Smoker  . Smokeless tobacco: Never Used  Substance and Sexual Activity  . Alcohol use: Yes    Alcohol/week: 4.2 oz    Types: 7 Standard drinks or equivalent per week    Comment: one scotch each evening  . Drug use: No  . Sexual activity: No    Birth control/protection: Post-menopausal  Other Topics Concern  . Not on file  Social History Narrative   Lives alone, with her cats.    Her daughter lives in [place], [place].   Her son lives in [place], [place].     Allergies  Allergen Reactions  . Codeine Itching  . Sulfa Antibiotics Itching     Prior to Admission medications   Medication Sig Start Date End Date Taking? Authorizing Provider  LORazepam (ATIVAN) 0.5 MG tablet Take 1 tablet (0.5 mg total) by mouth at bedtime as needed for anxiety or sleep. 08/20/17  Yes Charlotte Mons, PA-C  Naphazoline-Pheniramine 0.027-0.315 % SOLN Apply 1-2 drops 4 (four) times daily as needed to eye. 10/09/17  Yes McVey, Gelene Mink, PA-C     Depression screen [facility] 2/9 10/22/2017 08/20/2017 08/17/2017  Decreased Interest 0 0 0  Down, Depressed, Hopeless 0 0 0  PHQ - 2 Score 0 0 0     Fall Risk  10/22/2017 08/20/2017 08/17/2017  Falls in the past year? No No No      PHYSICAL EXAM: BP 126/82 (BP Location: Right Arm, Patient Position: Sitting, Cuff Size: Normal)   Pulse 86   Temp 98.4 F (36.9 C) (Oral)   Resp 18   Ht 5' 2.25" (1.581 m)   Wt 114 lb 12.8 oz (52.1 kg)   SpO2 97%   BMI 20.83 kg/m    Wt Readings from Last 3 Encounters:  10/22/17 114 lb 12.8 oz (52.1 kg)  10/09/17 114 lb 9.6 oz (52 kg)  08/20/17 110 lb 9.6 oz (50.2 kg)      Visual Acuity Screening   Right eye Left eye  Both eyes  Without correction: 20/20 20/20 20/20   With correction:     Hearing Screening Comments: Pt could hear whispered colors for whisper exam    Physical Exam  Constitutional: She is oriented to person, place, and time. She appears well-developed and well-nourished. She is active and cooperative. No distress.  HENT:  Head: Normocephalic and atraumatic.  Right Ear: Hearing, tympanic membrane, external ear and ear canal normal.  Left Ear: Hearing, tympanic membrane, external ear and ear canal normal.  Nose: Nose normal.  Mouth/Throat: Uvula is midline, oropharynx is clear and moist and mucous membranes are normal. No oral lesions. No uvula swelling. No oropharyngeal exudate.  Eyes: Conjunctivae, EOM and lids are normal. Pupils are equal, round, and reactive to light. Right eye exhibits no discharge. Left eye exhibits no discharge. No scleral icterus.  Fundoscopic exam:      The right eye shows no hemorrhage and no papilledema. The right eye shows red reflex.       The left eye shows no hemorrhage and no papilledema. The left eye shows red reflex.  Neck: Normal range of motion, full passive range of motion without pain and phonation normal. Neck supple. No thyromegaly present.  Cardiovascular: Normal rate, regular rhythm, normal heart sounds and intact distal pulses. Exam reveals no gallop and no friction rub.  No murmur heard. Respiratory: Effort normal and breath sounds normal.  GI: Soft. Normal appearance and bowel sounds are normal. There is no hepatosplenomegaly. There is no tenderness.  Musculoskeletal:       Cervical back: Normal.       Thoracic back: Normal.       Lumbar back: Normal.  Lymphadenopathy:       Head (right side): No submandibular and no tonsillar adenopathy present.       Head (left side): No submandibular and no tonsillar adenopathy present.    She has no cervical adenopathy.       Right: No supraclavicular adenopathy present.       Left: No supraclavicular  adenopathy present.  Neurological: She is alert and oriented to person, place, and time. She has normal strength. No cranial nerve deficit or sensory deficit.  Skin: Skin is warm and dry. No rash noted. She is not diaphoretic.  Psychiatric: She has a normal mood and affect. Her speech is normal and behavior is normal. Judgment and thought content normal. Cognition and memory are normal.     Education/Counseling provided regarding diet and exercise, prevention of chronic diseases, smoking/tobacco cessation, if applicable, and reviewed "Covered Medicare Preventive Services."   ASSESSMENT/PLAN: 1. Medicare annual wellness visit, subsequent Obtain records from her previous provider. Increase oral hydration and stay active. Consider OTC rewetting drops or natural tears for dry eyes. Not interested in pursuing additional treatment from podiatry regarding her toes-continue "bumper" pads.    No Follow-up on file.   Fara Chute, PA-C [facility] [place]

## 2017-10-22 NOTE — Patient Instructions (Addendum)
1. Try changing the eye drops to a simple rewetting drop or natural tears product. 2. Get the [facility] shingles vaccine (2 doses) from your pharmacy. 3. Check your will. Make sure it includes [place] and Living Will. Consider reading Being Mortal by Eda Keys, MD 4. We will request your records from [facility] before doing additional testing, etc.  We recommend that you schedule a mammogram for breast cancer screening. Typically, you do not need a referral to do this. Please contact a local imaging center to schedule your mammogram.  [facility] - [phone]  *ask for the Radiology Department [facility]) - [phone] or [phone]  [facility] - [phone] [place] [phone] [facility]  - [phone]  *ask for the [facility] - [phone]  *ask for the Radiology Department [facility] - [phone]  *ask for the [facility] - [phone]    IF you received an x-ray today, you will receive an invoice from [facility]. Please contact [facility] Radiology at [phone] with questions or concerns regarding your invoice.   IF you received labwork today, you will receive an invoice from [facility]. Please contact [facility] at [phone] with questions or concerns regarding your invoice.   Our billing staff will not be able to assist you with questions regarding bills from these companies.  You will be contacted with the lab results as soon as they are available. The fastest way to get your results is to activate your My Chart account. Instructions are located on the last page of this paperwork. If you have not heard from [place] regarding the results in 2 weeks, please contact this office.     Preventive Care 70 Years and Older, Female Preventive care refers to lifestyle choices and  visits with your health care provider that can promote health and wellness. What does preventive care include?  A yearly physical exam. This is also called an annual well check.  Dental exams once or twice a year.  Routine eye exams. Ask your health care provider how often you should have your eyes checked.  Personal lifestyle choices, including: ? Daily care of your teeth and gums. ? Regular physical activity. ? Eating a healthy diet. ? Avoiding tobacco and drug use. ? Limiting alcohol use. ? Practicing safe sex. ? Taking low-dose aspirin every day. ? Taking vitamin and mineral supplements as recommended by your health care provider. What happens during an annual well check? The services and screenings done by your health care provider during your annual well check will depend on your age, overall health, lifestyle risk factors, and family history of disease. Counseling Your health care provider may ask you questions about your:  Alcohol use.  Tobacco use.  Drug use.  Emotional well-being.  Home and relationship well-being.  Sexual activity.  Eating habits.  History of falls.  Memory and ability to understand (cognition).  Work and work Statistician.  Reproductive health.  Screening You may have the following tests or measurements:  Height, weight, and BMI.  Blood pressure.  Lipid and cholesterol levels. These may be checked every 5 years, or more frequently if you are over 7 years old.  Skin check.  Lung cancer screening. You may have this screening every year starting at age 83 if you have a 30-pack-year history of smoking and currently smoke or have quit within the past 15 years.  Fecal occult blood test (FOBT) of the stool. You may have this test every year starting at age 59.  Flexible sigmoidoscopy or colonoscopy. You may have a sigmoidoscopy every 5 years or a colonoscopy every 10 years starting at age 62.  Hepatitis C blood test.  Hepatitis B  blood test.  Sexually transmitted disease (STD) testing.  Diabetes screening. This is done by checking your blood sugar (glucose) after you have not eaten for a while (fasting). You may have this done every 1-3 years.  Bone density scan. This is done to screen for osteoporosis. You may have this done starting at age 81.  Mammogram. This may be done every 1-2 years. Talk to your health care provider about how often you should have regular mammograms.  Talk with your health care provider about your test results, treatment options, and if necessary, the need for more tests. Vaccines Your health care provider may recommend certain vaccines, such as:  Influenza vaccine. This is recommended every year.  Tetanus, diphtheria, and acellular pertussis (Tdap, Td) vaccine. You may need a Td booster every 10 years.  Varicella vaccine. You may need this if you have not been vaccinated.  Zoster vaccine. You may need this after age 45.  Measles, mumps, and rubella (MMR) vaccine. You may need at least one dose of MMR if you were born in 1957 or later. You may also need a second dose.  Pneumococcal 13-valent conjugate (PCV13) vaccine. One dose is recommended after age 49.  Pneumococcal polysaccharide (PPSV23) vaccine. One dose is recommended after age 20.  Meningococcal vaccine. You may need this if you have certain conditions.  Hepatitis A vaccine. You may need this if you have certain conditions or if you travel or work in places where you may be exposed to hepatitis A.  Hepatitis B vaccine. You may need this if you have certain conditions or if you travel or work in places where you may be exposed to hepatitis B.  Haemophilus influenzae type b (Hib) vaccine. You may need this if you have certain conditions.  Talk to your health care provider about which screenings and vaccines you need and how often you need them. This information is not intended to replace advice given to you by your health  care provider. Make sure you discuss any questions you have with your health care provider. Document Released: 12/09/2015 Document Revised: 08/01/2016 Document Reviewed: 09/13/2015 Elsevier Interactive Patient Education  2017 [employer].

## 2017-10-22 NOTE — Progress Notes (Deleted)
     Patient ID: Isac Caddy, female    DOB: 26-Apr-1946, 71 y.o.   MRN: [id number]  PCP: Harrison Mons, PA-C  Chief Complaint  Patient presents with  . Annual Exam    Medicare CPE    Subjective:   Presents for evaluation of ***.  ***.    Review of Systems     Patient Active Problem List   Diagnosis Date Noted  . Colicky RUQ abdominal pain 08/20/2017  . Insomnia 08/20/2017     Prior to Admission medications   Medication Sig Start Date End Date Taking? Authorizing Provider  LORazepam (ATIVAN) 0.5 MG tablet Take 1 tablet (0.5 mg total) by mouth at bedtime as needed for anxiety or sleep. 08/20/17  Yes Harrison Mons, PA-C  Naphazoline-Pheniramine 0.027-0.315 % SOLN Apply 1-2 drops 4 (four) times daily as needed to eye. 10/09/17  Yes McVey, Gelene Mink, PA-C     Allergies  Allergen Reactions  . Codeine Itching  . Sulfa Antibiotics Itching       Objective:  Physical Exam         Assessment & Plan:   ***

## 2017-10-27 ENCOUNTER — Encounter: Payer: Self-pay | Admitting: Physician Assistant

## 2017-10-27 DIAGNOSIS — M19042 Primary osteoarthritis, left hand: Secondary | ICD-10-CM

## 2017-10-27 DIAGNOSIS — M8580 Other specified disorders of bone density and structure, unspecified site: Secondary | ICD-10-CM | POA: Insufficient documentation

## 2017-10-27 DIAGNOSIS — M545 Low back pain: Secondary | ICD-10-CM

## 2017-10-27 DIAGNOSIS — R768 Other specified abnormal immunological findings in serum: Secondary | ICD-10-CM | POA: Insufficient documentation

## 2017-10-27 DIAGNOSIS — I7300 Raynaud's syndrome without gangrene: Secondary | ICD-10-CM

## 2017-10-27 DIAGNOSIS — M19041 Primary osteoarthritis, right hand: Secondary | ICD-10-CM | POA: Insufficient documentation

## 2017-10-27 DIAGNOSIS — G8929 Other chronic pain: Secondary | ICD-10-CM

## 2017-10-27 DIAGNOSIS — Z8719 Personal history of other diseases of the digestive system: Secondary | ICD-10-CM

## 2017-10-27 DIAGNOSIS — E559 Vitamin D deficiency, unspecified: Secondary | ICD-10-CM | POA: Insufficient documentation

## 2017-10-27 NOTE — Progress Notes (Signed)
Records received from Dr. Dicky Doe at [facility], Rheumatology. Office note and labs 01/10/2017. Abstracted. No vaccines included in documentation. Will await records from PCP.

## 2017-11-20 ENCOUNTER — Ambulatory Visit: Payer: Medicare PPO | Admitting: Physician Assistant

## 2017-11-20 ENCOUNTER — Other Ambulatory Visit: Payer: Self-pay

## 2017-11-20 ENCOUNTER — Encounter: Payer: Self-pay | Admitting: Physician Assistant

## 2017-11-20 VITALS — BP 120/80 | HR 98 | Temp 98.1°F | Resp 16 | Ht 62.25 in | Wt 113.2 lb

## 2017-11-20 DIAGNOSIS — H04123 Dry eye syndrome of bilateral lacrimal glands: Secondary | ICD-10-CM

## 2017-11-20 NOTE — Progress Notes (Signed)
Patient ID: Charlotte Williams, female    DOB: [birth year], 71 y.o.   MRN: [id number]  PCP: Harrison Mons, PA-C  Chief Complaint  Patient presents with  . eye problems    ongoing, itchy, burning, weeping and swelling.  Dry eye medication not helping and seems to make them weep more, no problems seeing.  Wakeup eyes are crusty and red.    Subjective:   Presents for evaluation of dry, itchy, watery eyes.  Symptoms began after her annual eyes exam, though she has not contacted her eye specialist. She notes that that exam was more comprehensive, and uncomfortable, than previous eye exams.  She mentioned this to me at her wellness visit 4 weeks ago, and I recommended Systane for presumed dry eyes. Her pharmacist recommended Visine for allergies. Both seem to cause burning, the Systane moreso than the Visine.   "I feel like I look like a devil. My eyes are just not this red." Has stopped wearing her eye makeup, concerned that she may have developed an allergy, and so doesn't want to leave her house. Feels like there's something in her eyes. Keeps rubbing. Tea tree oil in water applied with a cotton ball helps some.  No colored drainage. No visual disturbance. Eyes feel "glassy."  I note that she has a history of elevated rheumatoid factor.   Review of Systems As above. No URI-type symptoms.    Patient Active Problem List   Diagnosis Date Noted  . Elevated rheumatoid factor 10/27/2017  . Raynaud's phenomenon 10/27/2017  . Osteopenia 10/27/2017  . History of diverticulitis 10/27/2017  . Vitamin D insufficiency 10/27/2017  . Osteoarthritis of both hands 10/27/2017  . Chronic low back pain 10/27/2017  . Colicky RUQ abdominal pain 08/20/2017  . Insomnia 08/20/2017     Prior to Admission medications   Medication Sig Start Date End Date Taking? Authorizing Provider  LORazepam (ATIVAN) 0.5 MG tablet Take 1 tablet (0.5 mg total) by mouth at bedtime as needed for anxiety or  sleep. 08/20/17  Yes Harrison Mons, PA-C  Naphazoline-Pheniramine 0.027-0.315 % SOLN Apply 1-2 drops 4 (four) times daily as needed to eye. 10/09/17  Yes McVey, Gelene Mink, PA-C     Allergies  Allergen Reactions  . Codeine Itching  . Sulfa Antibiotics Itching       Objective:  Physical Exam  Constitutional: She is oriented to person, place, and time. She appears well-developed and well-nourished. She is active and cooperative. No distress.  BP 120/80 (BP Location: Left Arm, Patient Position: Sitting, Cuff Size: Normal)   Pulse 98   Temp 98.1 F (36.7 C) (Oral)   Resp 16   Ht 5' 2.25" (1.581 m)   Wt 113 lb 3.2 oz (51.3 kg)   SpO2 97%   BMI 20.54 kg/m    HENT:  Head: Normocephalic and atraumatic.  Eyes: EOM and lids are normal. Pupils are equal, round, and reactive to light. Right conjunctiva is not injected. Right conjunctiva has no hemorrhage. Left conjunctiva is not injected. Left conjunctiva has no hemorrhage. No scleral icterus.  Conjunctiva appear dry bilaterally  Pulmonary/Chest: Effort normal.  Neurological: She is alert and oriented to person, place, and time.  Psychiatric: She has a normal mood and affect. Her speech is normal and behavior is normal.     Visual Acuity Screening   Right eye Left eye Both eyes  Without correction: 20/20 20/20 20/20   With correction:          Assessment &  Plan:   1. Dry eye syndrome of both eyes Stop the allergy drops. Rewetting/natural tears. Contact her eye specialist for additional evaluation and treatment. She may need steroid drops. Consider additional evaluation for connective tissue disorder, pending eye specialist evaluation.    No Follow-up on file.   Fara Chute, PA-C [facility] at [place]

## 2017-11-20 NOTE — Patient Instructions (Addendum)
[place] for an appointment to do more evaluation of your eyes. Find the Systane. Stop the Visine.  I like Cetaphil moisturizer. Clinique moisture surge Cool compresses may feel good on your eyes.    IF you received an x-ray today, you will receive an invoice from [facility] Rad[facility]. Please contact [facility] Radiology at [phone] with questions or concerns regarding your invoice.   IF you received labwork today, you will receive an invoice from [facility]. Please contact [facility] at [phone] with questions or concerns regarding your invoice.   Our billing staff will not be able to assist you with questions regarding bills from these companies.  You will be contacted with the lab results as soon as they are available. The fastest way to get your results is to activate your My Chart account. Instructions are located on the last page of this paperwork. If you have not heard from [place] regarding the results in 2 weeks, please contact this office.

## 2017-11-27 ENCOUNTER — Other Ambulatory Visit: Payer: Self-pay | Admitting: Physician Assistant

## 2017-11-27 DIAGNOSIS — H02132 Senile ectropion of right lower eyelid: Secondary | ICD-10-CM | POA: Diagnosis not present

## 2017-11-27 DIAGNOSIS — H40033 Anatomical narrow angle, bilateral: Secondary | ICD-10-CM | POA: Diagnosis not present

## 2017-11-27 DIAGNOSIS — H02135 Senile ectropion of left lower eyelid: Secondary | ICD-10-CM | POA: Diagnosis not present

## 2017-11-27 DIAGNOSIS — H04523 Eversion of bilateral lacrimal punctum: Secondary | ICD-10-CM | POA: Diagnosis not present

## 2017-11-27 DIAGNOSIS — H43811 Vitreous degeneration, right eye: Secondary | ICD-10-CM | POA: Diagnosis not present

## 2017-11-27 DIAGNOSIS — H04123 Dry eye syndrome of bilateral lacrimal glands: Secondary | ICD-10-CM | POA: Diagnosis not present

## 2017-11-27 DIAGNOSIS — H2513 Age-related nuclear cataract, bilateral: Secondary | ICD-10-CM | POA: Diagnosis not present

## 2017-11-27 DIAGNOSIS — Z1231 Encounter for screening mammogram for malignant neoplasm of breast: Secondary | ICD-10-CM

## 2017-11-27 DIAGNOSIS — H10413 Chronic giant papillary conjunctivitis, bilateral: Secondary | ICD-10-CM | POA: Diagnosis not present

## 2017-12-11 DIAGNOSIS — H40033 Anatomical narrow angle, bilateral: Secondary | ICD-10-CM | POA: Diagnosis not present

## 2017-12-11 DIAGNOSIS — H02132 Senile ectropion of right lower eyelid: Secondary | ICD-10-CM | POA: Diagnosis not present

## 2017-12-11 DIAGNOSIS — H43811 Vitreous degeneration, right eye: Secondary | ICD-10-CM | POA: Diagnosis not present

## 2017-12-11 DIAGNOSIS — H02135 Senile ectropion of left lower eyelid: Secondary | ICD-10-CM | POA: Diagnosis not present

## 2017-12-11 DIAGNOSIS — H04123 Dry eye syndrome of bilateral lacrimal glands: Secondary | ICD-10-CM | POA: Diagnosis not present

## 2017-12-11 DIAGNOSIS — H10413 Chronic giant papillary conjunctivitis, bilateral: Secondary | ICD-10-CM | POA: Diagnosis not present

## 2017-12-11 DIAGNOSIS — H2513 Age-related nuclear cataract, bilateral: Secondary | ICD-10-CM | POA: Diagnosis not present

## 2017-12-30 ENCOUNTER — Encounter: Payer: Self-pay | Admitting: Physician Assistant

## 2017-12-30 DIAGNOSIS — F419 Anxiety disorder, unspecified: Secondary | ICD-10-CM | POA: Insufficient documentation

## 2017-12-30 DIAGNOSIS — E785 Hyperlipidemia, unspecified: Secondary | ICD-10-CM | POA: Insufficient documentation

## 2017-12-30 DIAGNOSIS — Z6820 Body mass index (BMI) 20.0-20.9, adult: Secondary | ICD-10-CM | POA: Insufficient documentation

## 2018-01-13 DIAGNOSIS — L570 Actinic keratosis: Secondary | ICD-10-CM | POA: Diagnosis not present

## 2018-01-13 DIAGNOSIS — L821 Other seborrheic keratosis: Secondary | ICD-10-CM | POA: Diagnosis not present

## 2018-01-13 DIAGNOSIS — D1801 Hemangioma of skin and subcutaneous tissue: Secondary | ICD-10-CM | POA: Diagnosis not present

## 2018-01-13 DIAGNOSIS — L218 Other seborrheic dermatitis: Secondary | ICD-10-CM | POA: Diagnosis not present

## 2018-01-13 DIAGNOSIS — D225 Melanocytic nevi of trunk: Secondary | ICD-10-CM | POA: Diagnosis not present

## 2018-01-13 DIAGNOSIS — D2372 Other benign neoplasm of skin of left lower limb, including hip: Secondary | ICD-10-CM | POA: Diagnosis not present

## 2018-02-03 ENCOUNTER — Ambulatory Visit
Admission: RE | Admit: 2018-02-03 | Discharge: 2018-02-03 | Disposition: A | Discharge status: Home / Self Care | Payer: Medicare PPO | Source: Ambulatory Visit | Attending: Physician Assistant | Admitting: Physician Assistant

## 2018-02-03 DIAGNOSIS — Z1231 Encounter for screening mammogram for malignant neoplasm of breast: Secondary | ICD-10-CM | POA: Diagnosis not present

## 2018-02-07 ENCOUNTER — Other Ambulatory Visit: Payer: Self-pay | Admitting: Physician Assistant

## 2018-02-07 DIAGNOSIS — G4700 Insomnia, unspecified: Secondary | ICD-10-CM

## 2018-02-07 NOTE — Telephone Encounter (Signed)
Rx refill request: Ativan 0.5 mg  LOV: 11/20/17  PCP: [place]: verified

## 2018-02-10 ENCOUNTER — Other Ambulatory Visit: Payer: Self-pay

## 2018-02-10 NOTE — Telephone Encounter (Signed)
Please advise 

## 2018-02-12 NOTE — Telephone Encounter (Signed)
Meds ordered this encounter  Medications  . LORazepam (ATIVAN) 0.5 MG tablet    Sig: TAKE ONE TABLET BY MOUTH AT BEDTIME AS NEEDED FOR ANXIETY OR SLEEP    Dispense:  30 tablet    Refill:  0

## 2018-02-17 DIAGNOSIS — L821 Other seborrheic keratosis: Secondary | ICD-10-CM | POA: Diagnosis not present

## 2018-02-17 DIAGNOSIS — C44729 Squamous cell carcinoma of skin of left lower limb, including hip: Secondary | ICD-10-CM | POA: Diagnosis not present

## 2018-02-17 DIAGNOSIS — L438 Other lichen planus: Secondary | ICD-10-CM | POA: Diagnosis not present

## 2018-02-17 DIAGNOSIS — D485 Neoplasm of uncertain behavior of skin: Secondary | ICD-10-CM | POA: Diagnosis not present

## 2018-03-03 ENCOUNTER — Encounter: Payer: Self-pay | Admitting: Physician Assistant

## 2018-05-08 ENCOUNTER — Other Ambulatory Visit: Payer: Self-pay | Admitting: Physician Assistant

## 2018-05-08 DIAGNOSIS — G4700 Insomnia, unspecified: Secondary | ICD-10-CM

## 2018-05-08 NOTE — Telephone Encounter (Signed)
Copied from [place]. Topic: Quick Communication - See Telephone Encounter >> May 08, 2018  3:16 PM Ivar Drape wrote: CRM for notification. See Telephone encounter for: 05/08/18. [facility] [place] would like [facility] prescription sent for LORazepam (ATIVAN) 0.5 MG tablet and [facility] [place], [place]

## 2018-05-09 NOTE — Telephone Encounter (Signed)
Rx request: Ativan 0.5mg     Last filled: 02/12/18 #30       LOV: 11/20/17  PCP: [place]: [facility]

## 2018-05-12 NOTE — Telephone Encounter (Signed)
Pt called back and advised of message below. She stated that she will not be doing that.

## 2018-05-12 NOTE — Telephone Encounter (Signed)
What is her plan?

## 2018-05-12 NOTE — Telephone Encounter (Signed)
Patient called, left VM to return call back to the office to let us know if she is planning on staying at [place] or following Harrison Mons to her new location.

## 2018-05-12 NOTE — Telephone Encounter (Signed)
Please advise patient she is due for an office visit.   She will need to establish care with another provider her for refills.

## 2018-05-13 MED ORDER — LORAZEPAM 0.5 MG PO TABS: ORAL_TABLET | ORAL | 0 refills | Status: DC

## 2018-05-13 NOTE — Telephone Encounter (Signed)
Patient is requesting a refill of the following medications: Requested Prescriptions   Pending Prescriptions Disp Refills  . LORazepam (ATIVAN) 0.5 MG tablet 30 tablet 0    Date of patient request: 05/12/2018 Last office visit: 10/22/2017 Date of last refill: 02/12/2018   We have never filled flonase.

## 2018-05-13 NOTE — Telephone Encounter (Signed)
Please let the patient know that I sent in her Rx but it is time for an OV - she will need to schedule.

## 2018-05-13 NOTE — Telephone Encounter (Signed)
Patient called and states that she is going to follow [facility] to her new location But in the meantime she is needing her LORazepam (ATIVAN) 0.5 MG tablet and Flonase refilled as well The Flonase is not on her current med list.   Pt is requesting a call back CB # [phone].

## 2018-07-29 DIAGNOSIS — K649 Unspecified hemorrhoids: Secondary | ICD-10-CM | POA: Diagnosis not present

## 2018-07-29 DIAGNOSIS — F419 Anxiety disorder, unspecified: Secondary | ICD-10-CM | POA: Diagnosis not present

## 2018-09-30 DIAGNOSIS — Z111 Encounter for screening for respiratory tuberculosis: Secondary | ICD-10-CM | POA: Diagnosis not present

## 2018-11-10 DIAGNOSIS — J0100 Acute maxillary sinusitis, unspecified: Secondary | ICD-10-CM | POA: Diagnosis not present

## 2018-11-13 DIAGNOSIS — Z1329 Encounter for screening for other suspected endocrine disorder: Secondary | ICD-10-CM | POA: Diagnosis not present

## 2018-11-13 DIAGNOSIS — E559 Vitamin D deficiency, unspecified: Secondary | ICD-10-CM | POA: Diagnosis not present

## 2018-11-13 DIAGNOSIS — Z1389 Encounter for screening for other disorder: Secondary | ICD-10-CM | POA: Diagnosis not present

## 2018-11-13 DIAGNOSIS — Z0000 Encounter for general adult medical examination without abnormal findings: Secondary | ICD-10-CM | POA: Diagnosis not present

## 2018-11-13 DIAGNOSIS — Z1211 Encounter for screening for malignant neoplasm of colon: Secondary | ICD-10-CM | POA: Diagnosis not present

## 2018-11-13 DIAGNOSIS — Z130 Encounter for screening for diseases of the blood and blood-forming organs and certain disorders involving the immune mechanism: Secondary | ICD-10-CM | POA: Diagnosis not present

## 2018-11-13 DIAGNOSIS — J0100 Acute maxillary sinusitis, unspecified: Secondary | ICD-10-CM | POA: Diagnosis not present

## 2018-11-13 DIAGNOSIS — Z13228 Encounter for screening for other metabolic disorders: Secondary | ICD-10-CM | POA: Diagnosis not present

## 2018-11-13 DIAGNOSIS — E785 Hyperlipidemia, unspecified: Secondary | ICD-10-CM | POA: Diagnosis not present

## 2018-12-02 ENCOUNTER — Ambulatory Visit: Payer: Medicare PPO | Admitting: Podiatry

## 2018-12-02 ENCOUNTER — Ambulatory Visit (INDEPENDENT_AMBULATORY_CARE_PROVIDER_SITE_OTHER): Payer: Medicare PPO

## 2018-12-02 ENCOUNTER — Other Ambulatory Visit: Payer: Self-pay | Admitting: Podiatry

## 2018-12-02 ENCOUNTER — Encounter: Payer: Self-pay | Admitting: Podiatry

## 2018-12-02 DIAGNOSIS — M2022 Hallux rigidus, left foot: Secondary | ICD-10-CM

## 2018-12-02 DIAGNOSIS — M779 Enthesopathy, unspecified: Secondary | ICD-10-CM

## 2018-12-02 MED ORDER — DICLOFENAC SODIUM 1 % TD GEL: 2.0000 g | Freq: Four times a day (QID) | TRANSDERMAL | 2 refills | Status: DC

## 2018-12-07 NOTE — Progress Notes (Signed)
Subjective:   Patient ID: Charlotte Williams, female   DOB: 73 y.o.   MRN: [id number]   HPI 73 year old female presents the office today for concerns of pain on her left foot.  She states that in 1980 she had a cheilectomy in her left foot.  She did well for several years however she started to notice that her toe does not bend as well in her left foot and more recently she started to develop pain to the bottom of her left big toe.  She states it started after she started using toe separators between her big toe and second toe but denies any recent injury or trauma.  No significant swelling or redness.  Since she stopped using the toe separators she is better she has been feeling better.  She feels that there is a knot underneath her big toe.   Review of Systems  All other systems reviewed and are negative.   Past Medical History:  Diagnosis Date  . Arthritis     Past Surgical History:  Procedure Laterality Date  . APPENDECTOMY       Current Outpatient Medications:  .  azelastine (ASTELIN) 0.1 % nasal spray, PLACE 1 SPRAY IN EACH NOSTRIL TWICE A DAY, Disp: , Rfl:  .  hydrocortisone 2.5 % cream, Apply to affected area 2 times daily, Disp: , Rfl:  .  diclofenac sodium (VOLTAREN) 1 % GEL, Apply 2 g topically 4 (four) times daily. Rub into affected area of foot 2 to 4 times daily, Disp: 100 g, Rfl: 2 .  LORazepam (ATIVAN) 0.5 MG tablet, TAKE ONE TABLET BY MOUTH AT BEDTIME AS NEEDED FOR ANXIETY OR SLEEP, Disp: 30 tablet, Rfl: 0  Allergies  Allergen Reactions  . Codeine Itching  . Sulfa Antibiotics Itching         Objective:  Physical Exam  General: AAO x3, NAD  Dermatological: Skin is warm, dry and supple bilateral. Nails x 10 are well manicured; remaining integument appears unremarkable at this time. There are no open sores, no preulcerative lesions, no rash or signs of infection present.  Vascular: Dorsalis Pedis artery and Posterior Tibial artery pedal pulses are 2/4 bilateral  with immedate capillary fill time. There is no pain with calf compression, swelling, warmth, erythema.   Neruologic: Grossly intact via light touch bilateral. . Protective threshold with Semmes Wienstein monofilament intact to all pedal sites bilateral.   Musculoskeletal: There is no motion present to the left first MPJ.  On the plantar aspect the left hallux IPJ is a bony prominence present.  This is where she is getting her discomfort but there is no specific area of pinpoint tenderness there is no edema, erythema.  She has some crepitation with MPJ range of motion of the right foot but she still has motion on the right first MPJ.  There is no other areas of tenderness identified.  Muscular strength 5/5 in all groups tested bilateral.  Gait: Unassisted, Nonantalgic.       Assessment:   Hallux rigidus, plantar hallux IPJ pain     Plan:  -Treatment options discussed including all alternatives, risks, and complications -Etiology of symptoms were discussed -X-rays were obtained and reviewed with the patient.  Significant arthritic changes are present of the left first MPJ.  No evidence of acute fracture identified. -We discussed multiple treatment options.  Overall her symptoms are improving on their own after she stopped using the toe separators.  Discussed padding and offloading for the plantar hallux IPJ prominence.  We also discussed orthotics.  Rick evaluate her today and she was dispensed over-the-counter diabetic type insert to help offload the area.  If symptoms continue will consider a custom orthotic.   Trula Slade DPM

## 2018-12-29 ENCOUNTER — Encounter: Payer: Self-pay | Admitting: Internal Medicine

## 2019-01-20 DIAGNOSIS — L84 Corns and callosities: Secondary | ICD-10-CM | POA: Diagnosis not present

## 2019-01-20 DIAGNOSIS — Z85828 Personal history of other malignant neoplasm of skin: Secondary | ICD-10-CM | POA: Diagnosis not present

## 2019-01-20 DIAGNOSIS — L821 Other seborrheic keratosis: Secondary | ICD-10-CM | POA: Diagnosis not present

## 2019-01-20 DIAGNOSIS — D1801 Hemangioma of skin and subcutaneous tissue: Secondary | ICD-10-CM | POA: Diagnosis not present

## 2019-01-20 DIAGNOSIS — L812 Freckles: Secondary | ICD-10-CM | POA: Diagnosis not present

## 2019-01-21 ENCOUNTER — Other Ambulatory Visit: Payer: Self-pay | Admitting: Physician Assistant

## 2019-01-21 DIAGNOSIS — Z1231 Encounter for screening mammogram for malignant neoplasm of breast: Secondary | ICD-10-CM

## 2019-01-26 ENCOUNTER — Telehealth: Payer: Self-pay | Admitting: Internal Medicine

## 2019-01-26 ENCOUNTER — Encounter: Payer: Self-pay | Admitting: Internal Medicine

## 2019-01-26 ENCOUNTER — Ambulatory Visit (AMBULATORY_SURGERY_CENTER): Payer: Self-pay

## 2019-01-26 VITALS — Ht 62.5 in | Wt 116.0 lb

## 2019-01-26 DIAGNOSIS — Z1211 Encounter for screening for malignant neoplasm of colon: Secondary | ICD-10-CM

## 2019-01-26 MED ORDER — NA SULFATE-K SULFATE-MG SULF 17.5-3.13-1.6 GM/177ML PO SOLN: 1.0000 | Freq: Once | ORAL | 0 refills | Status: DC

## 2019-01-26 MED ORDER — NA SULFATE-K SULFATE-MG SULF 17.5-3.13-1.6 GM/177ML PO SOLN: ORAL | 0 refills | Status: DC

## 2019-01-26 NOTE — Telephone Encounter (Signed)
Pt would like to speak with you regarding her pharmacy information. Pls call her.

## 2019-01-26 NOTE — Telephone Encounter (Signed)
Spoke with patient. She states she wants the Suprep sent to [facility] [place]. Also the cost is $108 per pt. Suprep and  PNM $50 coupon called into pharmacy [facility] per pt's request. Suprep d/c'ed at [facility].  Pt aware.

## 2019-01-26 NOTE — Progress Notes (Signed)
Denies allergies to eggs or soy products. Denies complication of anesthesia or sedation. Denies use of weight loss medication. Denies use of O2.   Emmi instructions declined.  

## 2019-02-09 ENCOUNTER — Encounter: Payer: Self-pay | Admitting: Internal Medicine

## 2019-02-09 ENCOUNTER — Ambulatory Visit (AMBULATORY_SURGERY_CENTER): Payer: Medicare PPO | Admitting: Internal Medicine

## 2019-02-09 ENCOUNTER — Other Ambulatory Visit: Payer: Self-pay

## 2019-02-09 VITALS — BP 138/80 | HR 71 | Temp 99.3°F | Resp 21 | Ht 62.0 in | Wt 116.0 lb

## 2019-02-09 DIAGNOSIS — D125 Benign neoplasm of sigmoid colon: Secondary | ICD-10-CM

## 2019-02-09 DIAGNOSIS — Z1211 Encounter for screening for malignant neoplasm of colon: Secondary | ICD-10-CM

## 2019-02-09 DIAGNOSIS — D123 Benign neoplasm of transverse colon: Secondary | ICD-10-CM

## 2019-02-09 DIAGNOSIS — K635 Polyp of colon: Secondary | ICD-10-CM

## 2019-02-09 DIAGNOSIS — D122 Benign neoplasm of ascending colon: Secondary | ICD-10-CM | POA: Diagnosis not present

## 2019-02-09 DIAGNOSIS — K621 Rectal polyp: Secondary | ICD-10-CM

## 2019-02-09 MED ORDER — SODIUM CHLORIDE 0.9 % IV SOLN: 500.0000 mL | Freq: Once | INTRAVENOUS | Status: DC

## 2019-02-09 NOTE — Op Note (Signed)
[place] Patient Name: Charlotte Williams Procedure Date: 02/09/2019 9:32 AM MRN: [id number] Endoscopist: Jerene Bears , MD Age: 73 Referring MD:  Date of Birth: 06-24-1946 Gender: Female Account #: [id number] Procedure:                Colonoscopy Indications:              Screening for colorectal malignant neoplasm, Last                            colonoscopy 10 years ago Medicines:                Monitored Anesthesia Care Procedure:                Pre-Anesthesia Assessment:                           - Prior to the procedure, a History and Physical                            was performed, and patient medications and                            allergies were reviewed. The patient's tolerance of                            previous anesthesia was also reviewed. The risks                            and benefits of the procedure and the sedation                            options and risks were discussed with the patient.                            All questions were answered, and informed consent                            was obtained. Prior Anticoagulants: The patient has                            taken no previous anticoagulant or antiplatelet                            agents. ASA Grade Assessment: II - A patient with                            mild systemic disease. After reviewing the risks                            and benefits, the patient was deemed in                            satisfactory condition to undergo the procedure.  After obtaining informed consent, the colonoscope                            was passed under direct vision. Throughout the                            procedure, the patient's blood pressure, pulse, and                            oxygen saturations were monitored continuously. The                            Colonoscope was introduced through the anus and                            advanced to the cecum, identified by  appendiceal                            orifice and ileocecal valve. The colonoscopy was                            performed without difficulty. The patient tolerated                            the procedure well. The quality of the bowel                            preparation was good. The ileocecal valve,                            appendiceal orifice, and rectum were photographed. Scope In: 9:44:08 AM Scope Out: 10:07:51 AM Scope Withdrawal Time: 0 hours 19 minutes 57 seconds  Total Procedure Duration: 0 hours 23 minutes 43 seconds  Findings:                 The digital rectal exam was normal.                           Two sessile polyps were found in the ascending                            colon. The polyps were 4 to 6 mm in size. These                            polyps were removed with a cold snare. Resection                            and retrieval were complete.                           A 5 mm polyp was found in the transverse colon. The                            polyp was sessile.  The polyp was removed with a                            cold snare. Resection and retrieval were complete.                           Two sessile polyps were found in the sigmoid colon.                            The polyps were 5 to 8 mm in size. These polyps                            were removed with a cold snare. Resection and                            retrieval were complete.                           A 5 mm polyp was found in the rectum. The polyp was                            sessile. The polyp was removed with a cold snare.                            Resection and retrieval were complete.                           Multiple small and large-mouthed diverticula were                            found in the sigmoid colon and descending colon.                           Internal hemorrhoids were found during                            retroflexion. The hemorrhoids were small. Complications:             No immediate complications. Estimated Blood Loss:     Estimated blood loss was minimal. Impression:               - Two 4 to 6 mm polyps in the ascending colon,                            removed with a cold snare. Resected and retrieved.                           - One 5 mm polyp in the transverse colon, removed                            with a cold snare. Resected and retrieved.                           -  Two 5 to 8 mm polyps in the sigmoid colon,                            removed with a cold snare. Resected and retrieved.                           - One 5 mm polyp in the rectum, removed with a cold                            snare. Resected and retrieved.                           - Moderate diverticulosis in the sigmoid colon and                            in the descending colon.                           - Small internal hemorrhoids. Recommendation:           - Patient has a contact number available for                            emergencies. The signs and symptoms of potential                            delayed complications were discussed with the                            patient. Return to normal activities tomorrow.                            Written discharge instructions were provided to the                            patient.                           - Resume previous diet.                           - Continue present medications.                           - Await pathology results.                           - Repeat colonoscopy is recommended. The                            colonoscopy date will be determined after pathology                            results from today's exam become available for  review. Jerene Bears, MD 02/09/2019 10:13:55 AM This report has been signed electronically.

## 2019-02-09 NOTE — Patient Instructions (Signed)
You had 6 polyps removed today. Dr Hilarie Fredrickson will mail you the results in 1-3 weeks after he receives the lab report. He also saw diverticulosis today. Handouts given on polyps and diverticulosisand hemorrhoids. Start with a small, light meal today with low gas producing foods such as eggs, grits, toast, pancakes or waffles, or lean meat. Absolutely no alcohol or driving today.  YOU HAD AN ENDOSCOPIC PROCEDURE TODAY AT [facility]:   Refer to the procedure report that was given to you for any specific questions about what was found during the examination.  If the procedure report does not answer your questions, please call your gastroenterologist to clarify.  If you requested that your care partner not be given the details of your procedure findings, then the procedure report has been included in a sealed envelope for you to review at your convenience later.  YOU SHOULD EXPECT: Some feelings of bloating in the abdomen. Passage of more gas than usual.  Walking can help get rid of the air that was put into your GI tract during the procedure and reduce the bloating. If you had a lower endoscopy (such as a colonoscopy or flexible sigmoidoscopy) you may notice spotting of blood in your stool or on the toilet paper. If you underwent a bowel prep for your procedure, you may not have a normal bowel movement for a few days.  Please Note:  You might notice some irritation and congestion in your nose or some drainage.  This is from the oxygen used during your procedure.  There is no need for concern and it should clear up in a day or so.  SYMPTOMS TO REPORT IMMEDIATELY:   Following lower endoscopy (colonoscopy or flexible sigmoidoscopy):  Excessive amounts of blood in the stool  Significant tenderness or worsening of abdominal pains  Swelling of the abdomen that is new, acute  Fever of 100F or higher   For urgent or emergent issues, a gastroenterologist can be reached at any hour by  calling [phone].   DIET:  We do recommend a small meal at first, but then you may proceed to your regular diet.  Drink plenty of fluids but you should avoid alcoholic beverages for 24 hours.  ACTIVITY:  You should plan to take it easy for the rest of today and you should NOT DRIVE or use heavy machinery until tomorrow (because of the sedation medicines used during the test).    FOLLOW UP: Our staff will call the number listed on your records the next business day following your procedure to check on you and address any questions or concerns that you may have regarding the information given to you following your procedure. If we do not reach you, we will leave a message.  However, if you are feeling well and you are not experiencing any problems, there is no need to return our call.  We will assume that you have returned to your regular daily activities without incident.  If any biopsies were taken you will be contacted by phone or by letter within the next 1-3 weeks.  Please call us at [phone] if you have not heard about the biopsies in 3 weeks.    SIGNATURES/CONFIDENTIALITY: You and/or your care partner have signed paperwork which will be entered into your electronic medical record.  These signatures attest to the fact that that the information above on your After Visit Summary has been reviewed and is understood.  Full responsibility of the confidentiality of this  discharge information lies with you and/or your care-partner. 

## 2019-02-09 NOTE — Progress Notes (Signed)
Pt. Reports no change in her medical or surgical history since her pre-visit 01/26/2019.

## 2019-02-09 NOTE — Progress Notes (Signed)
Called to room to assist during endoscopic procedure.  Patient ID and intended procedure confirmed with present staff. Received instructions for my participation in the procedure from the performing physician.  

## 2019-02-09 NOTE — Progress Notes (Signed)
To PACU, VSS. Report to [email or web address] 

## 2019-02-10 ENCOUNTER — Telehealth: Payer: Self-pay | Admitting: *Deleted

## 2019-02-10 NOTE — Telephone Encounter (Signed)
  Follow up Call-  Call back number 02/09/2019  Post procedure Call Back phone  # [phone]  Permission to leave phone message Yes  Some recent data might be hidden     Patient questions:  Do you have a fever, pain , or abdominal swelling? No. Pain Score  0 *  Have you tolerated food without any problems? Yes.    Have you been able to return to your normal activities? Yes.    Do you have any questions about your discharge instructions: Diet   No. Medications  No. Follow up visit  No.  Do you have questions or concerns about your Care? Yes.  PT had questions about her diverticulosis. Referred her to her handout given to her in recovery and encouraged High Fiber diet which the patient stated that she does. No further questions. SM  Actions: * If pain score is 4 or above: No action needed, pain <4.

## 2019-02-13 ENCOUNTER — Encounter: Payer: Self-pay | Admitting: Internal Medicine

## 2019-02-16 ENCOUNTER — Ambulatory Visit: Payer: Medicare PPO

## 2019-03-18 ENCOUNTER — Ambulatory Visit: Payer: Medicare PPO

## 2019-05-11 ENCOUNTER — Ambulatory Visit
Admission: RE | Admit: 2019-05-11 | Discharge: 2019-05-11 | Disposition: A | Discharge status: Home / Self Care | Payer: Medicare PPO | Source: Ambulatory Visit | Attending: Physician Assistant | Admitting: Physician Assistant

## 2019-05-11 ENCOUNTER — Other Ambulatory Visit: Payer: Self-pay

## 2019-05-11 DIAGNOSIS — Z1231 Encounter for screening mammogram for malignant neoplasm of breast: Secondary | ICD-10-CM

## 2021-05-17 ENCOUNTER — Other Ambulatory Visit: Payer: Self-pay | Admitting: Physician Assistant

## 2021-05-17 DIAGNOSIS — Z1231 Encounter for screening mammogram for malignant neoplasm of breast: Secondary | ICD-10-CM

## 2021-07-11 ENCOUNTER — Ambulatory Visit
Admission: RE | Admit: 2021-07-11 | Discharge: 2021-07-11 | Disposition: A | Discharge status: Home / Self Care | Payer: Medicare PPO | Source: Ambulatory Visit | Attending: Physician Assistant | Admitting: Physician Assistant

## 2021-07-11 ENCOUNTER — Other Ambulatory Visit: Payer: Self-pay

## 2021-07-11 DIAGNOSIS — Z1231 Encounter for screening mammogram for malignant neoplasm of breast: Secondary | ICD-10-CM

## 2021-11-26 DIAGNOSIS — I700 Atherosclerosis of aorta: Secondary | ICD-10-CM

## 2021-11-26 HISTORY — DX: Atherosclerosis of aorta: I70.0

## 2022-02-02 ENCOUNTER — Encounter: Payer: Self-pay | Admitting: *Deleted

## 2022-02-13 ENCOUNTER — Other Ambulatory Visit (INDEPENDENT_AMBULATORY_CARE_PROVIDER_SITE_OTHER): Payer: Medicare PPO

## 2022-02-13 ENCOUNTER — Encounter: Payer: Self-pay | Admitting: Internal Medicine

## 2022-02-13 ENCOUNTER — Ambulatory Visit: Payer: Medicare PPO | Admitting: Internal Medicine

## 2022-02-13 VITALS — BP 130/80 | HR 88 | Ht 62.5 in | Wt 118.0 lb

## 2022-02-13 DIAGNOSIS — R630 Anorexia: Secondary | ICD-10-CM

## 2022-02-13 DIAGNOSIS — Z86010 Personal history of colonic polyps: Secondary | ICD-10-CM | POA: Diagnosis not present

## 2022-02-13 DIAGNOSIS — R109 Unspecified abdominal pain: Secondary | ICD-10-CM | POA: Diagnosis not present

## 2022-02-13 DIAGNOSIS — R12 Heartburn: Secondary | ICD-10-CM | POA: Diagnosis not present

## 2022-02-13 DIAGNOSIS — R1013 Epigastric pain: Secondary | ICD-10-CM | POA: Diagnosis not present

## 2022-02-13 DIAGNOSIS — R1011 Right upper quadrant pain: Secondary | ICD-10-CM | POA: Diagnosis not present

## 2022-02-13 LAB — COMPREHENSIVE METABOLIC PANEL
ALT: 19 U/L (ref 0–35)
AST: 22 U/L (ref 0–37)
Albumin: 4.3 g/dL (ref 3.5–5.2)
Alkaline Phosphatase: 54 U/L (ref 39–117)
BUN: 19 mg/dL (ref 6–23)
CO2: 26 mEq/L (ref 19–32)
Calcium: 9.3 mg/dL (ref 8.4–10.5)
Chloride: 102 mEq/L (ref 96–112)
Creatinine, Ser: 0.69 mg/dL (ref 0.40–1.20)
GFR: 85.48 mL/min (ref >60.00)
Glucose, Bld: 87 mg/dL (ref 70–99)
Potassium: 4.1 mEq/L (ref 3.5–5.1)
Sodium: 137 mEq/L (ref 135–145)
Total Bilirubin: 0.5 mg/dL (ref 0.2–1.2)
Total Protein: 6.7 g/dL (ref 6.0–8.3)

## 2022-02-13 MED ORDER — PLENVU 140 G PO SOLR: 1.0000 | ORAL | 0 refills | Status: DC

## 2022-02-13 MED ORDER — PANTOPRAZOLE SODIUM 40 MG PO TBEC: 40.0000 mg | DELAYED_RELEASE_TABLET | Freq: Every day | ORAL | 0 refills | Status: DC

## 2022-02-13 NOTE — Patient Instructions (Addendum)
We have sent the following medications to your pharmacy for you to pick up at your convenience: ?Pantoprazole 40 mg daily ?___________________________________________________ ?Your provider has requested that you go to the basement level for lab work before leaving today. Press "B" on the elevator. The lab is located at the first door on the left as you exit the elevator. ?___________________________________________________ ? ?You have been scheduled for a colonoscopy. Please follow written instructions given to you at your visit today.  ?Please pick up your prep supplies at the pharmacy within the next 1-3 days. ?If you use inhalers (even only as needed), please bring them with you on the day of your procedure. ?______________________________________________________ ? ?You have been scheduled for a CT scan of the abdomen and pelvis at [facility] (1st floor of hospital).  ? ?You are scheduled on Friday, 03/16/22 at 2:30 pm. You should arrive 15 minutes prior to your appointment time for registration. Please follow the written instructions below on the day of your exam: ? ?WARNING: IF YOU ARE ALLERGIC TO IODINE/X-RAY DYE, PLEASE NOTIFY RADIOLOGY IMMEDIATELY AT [phone]! YOU WILL BE GIVEN A 13 HOUR PREMEDICATION PREP. ? ?1) Do not eat or drink anything after 10:30 am (4 hours prior to your test) ?2) You have been given 2 bottles of oral contrast to drink. The solution may taste better if refrigerated, but do NOT add ice or any other liquid to this solution. Shake well before drinking. ?  ? Drink 1 bottle of contrast @ 12:30 pm (2 hours prior to your exam) ? Drink 1 bottle of contrast @ 1:30 pm (1 hour prior to your exam) ? ?You may take any medications as prescribed with a small amount of water, if necessary. If you take any of the following medications: METFORMIN, GLUCOPHAGE, GLUCOVANCE, AVANDAMET, RIOMET, FORTAMET, ACTOPLUS MET, JANUMET, [place] or METAGLIP, you MAY be asked to HOLD this medication 48  hours AFTER the exam. ? ?The purpose of you drinking the oral contrast is to aid in the visualization of your intestinal tract. The contrast solution may cause some diarrhea. Depending on your individual set of symptoms, you may also receive an intravenous injection of x-ray contrast/dye. Plan on being at [facility] for 30 minutes or longer, depending on the type of exam you are having performed. ? ?This test typically takes 30-45 minutes to complete. ? ?If you have any questions regarding your exam or if you need to reschedule, you may call the CT department at [phone] between the hours of 8:00 am and 5:00 pm, Monday-Friday. ? ?________________________________________________________ ?If you are age 10 or older, your body mass index should be between 23-30. Your Body mass index is 21.24 kg/m?Marland Kitchen If this is out of the aforementioned range listed, please consider follow up with your Primary Care Provider. ? ?If you are age 39 or younger, your body mass index should be between 19-25. Your Body mass index is 21.24 kg/m?Marland Kitchen If this is out of the aformentioned range listed, please consider follow up with your Primary Care Provider.  ? ?________________________________________________________ ? ?The [facility] providers would like to encourage you to use [facility] to communicate with providers for non-urgent requests or questions.  Due to long hold times on the telephone, sending your provider a message by [facility] may be a faster and more efficient way to get a response.  Please allow 48 business hours for a response.  Please remember that this is for non-urgent requests.  ?_______________________________________________________ ?Due to recent changes in healthcare laws, you  may see the results of your imaging and laboratory studies on MyChart before your provider has had a chance to review them.  We understand that in some cases there may be results that are confusing or concerning to you. Not all laboratory results come  back in the same time frame and the provider may be waiting for multiple results in order to interpret others.  Please give [place] 48 hours in order for your provider to thoroughly review all the results before contacting the office for clarification of your results.  ? ? ?

## 2022-02-13 NOTE — Progress Notes (Signed)
Patient ID: Charlotte Williams, female   DOB: 07/11/1946, 76 y.o.   MRN: [id number] ?HPI: ?Charlotte Williams is a 75 year old female with a history of adenomatous and sessile serrated colon polyps, colonic diverticulosis, osteopenia who is seen to discuss right upper quadrant abdominal pain, poor appetite and weight loss.  She is here alone today. ? ?She is known to me from her last colonoscopy which I performed for screening on 02/09/2019.  This revealed 6 polyps ranging in size from 4 to 8 mm.  5 of these were adenomatous or sessile serrated polyps without dysplasia.  The sixth polyp was hyperplastic and removed from the rectum. ? ?She reports that she has been having consistent right upper quadrant abdominal pain.  There is also rolling pain across her upper abdomen.  This is again episodic but it can be sharp and severe.  It can take her to her knees.  Only real relief is to lie down.  Also seems to get better if she eats.  It is therefore somewhat worse if she has not eaten.  Not associated with nausea.  Lasts about 10 minutes on average.  In between episodes feels "sore".  She has had some mild heartburn symptom.  She has had 1 or 2 episodes of isolating nonbloody emesis.  Lost about 5 pounds.  Eats very healthy.  Noticed it initially with bending over but then now does not seem to be movement related.  Bowel movements regular.  No blood in stool or melena. ? ?Primary care did an ultrasound very recently in October 2022.  This was done through [facility].  I reviewed it.  It shows no acute abnormalities.  There are left lobe liver cysts which appear either single and septated or 2 adjacent cysts without complicating feature.  There was no biliary ductal dilatation.  Gallbladder appeared normal with negative Murphy sign at that time. ? ?Past Medical History:  ?Diagnosis Date  ? Adenomatous colon polyp   ? Allergy   ? Arthritis   ? Diverticulosis   ? Internal hemorrhoids   ? Osteopenia   ? ? ?Past Surgical History:  ?Procedure  Laterality Date  ? APPENDECTOMY    ? ROTATOR CUFF REPAIR Right   ? Bicept repair  ? TONSILLECTOMY AND ADENOIDECTOMY    ? ? ?Outpatient Medications Prior to Visit  ?Medication Sig Dispense Refill  ? amLODipine (NORVASC) 5 MG tablet Take 1 tablet by mouth daily.    ? azelastine (ASTELIN) 0.1 % nasal spray PLACE 1 SPRAY IN EACH NOSTRIL TWICE A DAY    ? benzonatate (TESSALON) 100 MG capsule TAKE 1 TO 2 CAPSULES BY MOUTH 3 TIMES A DAY AS NEEDED    ? diclofenac sodium (VOLTAREN) 1 % GEL Apply 2 g topically 4 (four) times daily. Rub into affected area of foot 2 to 4 times daily 100 g 2  ? LORazepam (ATIVAN) 0.5 MG tablet TAKE ONE TABLET BY MOUTH AT BEDTIME AS NEEDED FOR ANXIETY OR SLEEP 30 tablet 0  ? meloxicam (MOBIC) 7.5 MG tablet Take 7.5 mg by mouth 2 (two) times daily as needed.    ? ?No facility-administered medications prior to visit.  ? ? ?Allergies  ?Allergen Reactions  ? Codeine Itching  ? Sulfa Antibiotics Itching  ? ? ?Family History  ?Problem Relation Age of Onset  ? Endometriosis Daughter   ? Dementia Mother   ? Endometriosis Mother   ? Heart disease Father   ? Stroke Father   ? Arthritis Paternal Aunt   ?  Rheum arthritis Paternal Aunt   ? Colon cancer Neg Hx   ? Esophageal cancer Neg Hx   ? Rectal cancer Neg Hx   ? Stomach cancer Neg Hx   ? ? ?Social History  ? ?Tobacco Use  ? Smoking status: Never  ? Smokeless tobacco: Never  ?Vaping Use  ? Vaping Use: Never used  ?Substance Use Topics  ? Alcohol use: Yes  ?  Alcohol/week: 7.0 standard drinks  ?  Types: 7 Standard drinks or equivalent per week  ?  Comment: one scotch each evening  ? Drug use: No  ? ? ?ROS: ?As per history of present illness, otherwise negative ? ?BP 130/80   Pulse 88   Ht 5' 2.5" (1.588 m)   Wt 118 lb (53.5 kg)   BMI 21.24 kg/m?  ?Gen: awake, alert, NAD ?HEENT: anicteric  ?CV: RRR, no mrg ?Pulm: CTA b/l ?Abd: soft, NT/ND, +BS throughout ?Ext: no c/c/e ?Neuro: nonfocal ? ?RELEVANT LABS AND IMAGING: ?Labs reviewed per care everywhere from  October 2022; AST 23, ALT 16, alk phos 61, total bili 0.3 ?Albumin 4.8 ?CBC normal ?TSH 1.61 ? ?ASSESSMENT/PLAN: ?76 year old female with a history of adenomatous and sessile serrated colon polyps, colonic diverticulosis, osteopenia who is seen to discuss right upper quadrant abdominal pain, poor appetite and weight loss.  ? ?Right upper quadrant abdominal pain/mild anorexia/heartburn--episodic in nature.  Abdominal ultrasound in October fairly unrevealing.  Benign-appearing liver cyst.  No overt gallbladder pathology.  Interesting that it seems to be better with eating, cannot rule out ulcer disease.  Mild heartburn. ?--CT scan abdomen pelvis with contrast ?--CBC and CMP today ?--Begin pantoprazole 40 mg every morning for 4 to 8 weeks ?--If CT unrevealing then upper endoscopy; we reviewed EGD including risk, benefits and alternatives and she is agreeable and wishes to proceed ? ?2.  History of adenomatous and sessile serrated colon polyps --5 of these lesions/polyps 3 years ago. ?--Surveillance colonoscopy recommended at this time.  We reviewed the risk, benefits and alternatives and she is agreeable and wishes to proceed ? ? ? ? ? ?[id number], [place], [place] ?[address] ?[place],  [place] [address] ? ? ?

## 2022-02-20 ENCOUNTER — Ambulatory Visit (HOSPITAL_COMMUNITY): Payer: Medicare PPO

## 2022-03-16 ENCOUNTER — Ambulatory Visit (HOSPITAL_COMMUNITY)
Admission: RE | Admit: 2022-03-16 | Discharge: 2022-03-16 | Disposition: A | Discharge status: Home / Self Care | Payer: Medicare PPO | Source: Ambulatory Visit | Attending: Internal Medicine | Admitting: Internal Medicine

## 2022-03-16 DIAGNOSIS — R630 Anorexia: Secondary | ICD-10-CM | POA: Insufficient documentation

## 2022-03-16 DIAGNOSIS — R1013 Epigastric pain: Secondary | ICD-10-CM | POA: Insufficient documentation

## 2022-03-16 MED ORDER — SODIUM CHLORIDE (PF) 0.9 % IJ SOLN
INTRAMUSCULAR | Status: DC
Start: 2022-03-16 — End: 2022-03-17
  Filled 2022-03-16: qty 50

## 2022-03-16 MED ORDER — IOHEXOL 300 MG/ML  SOLN
80.0000 mL | Freq: Once | INTRAMUSCULAR | Status: AC | PRN
  Administered 2022-03-16: 80 mL via INTRAVENOUS

## 2022-03-29 ENCOUNTER — Ambulatory Visit (AMBULATORY_SURGERY_CENTER): Payer: Medicare PPO | Admitting: Internal Medicine

## 2022-03-29 ENCOUNTER — Encounter: Payer: Self-pay | Admitting: Internal Medicine

## 2022-03-29 VITALS — BP 128/75 | HR 74 | Temp 97.7°F | Resp 15 | Ht 62.0 in | Wt 118.0 lb

## 2022-03-29 DIAGNOSIS — K635 Polyp of colon: Secondary | ICD-10-CM | POA: Diagnosis not present

## 2022-03-29 DIAGNOSIS — K3189 Other diseases of stomach and duodenum: Secondary | ICD-10-CM | POA: Diagnosis not present

## 2022-03-29 DIAGNOSIS — Z86010 Personal history of colonic polyps: Secondary | ICD-10-CM

## 2022-03-29 DIAGNOSIS — B3781 Candidal esophagitis: Secondary | ICD-10-CM | POA: Diagnosis not present

## 2022-03-29 DIAGNOSIS — R1011 Right upper quadrant pain: Secondary | ICD-10-CM | POA: Diagnosis not present

## 2022-03-29 DIAGNOSIS — K2970 Gastritis, unspecified, without bleeding: Secondary | ICD-10-CM

## 2022-03-29 DIAGNOSIS — R630 Anorexia: Secondary | ICD-10-CM | POA: Diagnosis not present

## 2022-03-29 DIAGNOSIS — D123 Benign neoplasm of transverse colon: Secondary | ICD-10-CM

## 2022-03-29 MED ORDER — FLUCONAZOLE 100 MG PO TABS: 100.0000 mg | ORAL_TABLET | Freq: Every day | ORAL | 0 refills | Status: AC

## 2022-03-29 MED ORDER — SODIUM CHLORIDE 0.9 % IV SOLN: 500.0000 mL | Freq: Once | INTRAVENOUS | Status: DC

## 2022-03-29 NOTE — Progress Notes (Signed)
Called to room to assist during endoscopic procedure.  Patient ID and intended procedure confirmed with present staff. Received instructions for my participation in the procedure from the performing physician.  

## 2022-03-29 NOTE — Op Note (Signed)
[facility] ?Patient Name: Charlotte Williams ?Procedure Date: 03/29/2022 3:31 PM ?MRN: [id number] ?Endoscopist: Jerene Bears , MD ?Age: 76 ?Referring MD:  ?Date of Birth: Feb 03, 1946 ?Gender: Female ?Account #: [id number] ?Procedure:                Colonoscopy ?Indications:              High risk colon cancer surveillance: Personal  ?                          history of multiple adenomas and sessile serrated  ?                          colon polyps, Last colonoscopy: March 2020 ?Medicines:                Monitored Anesthesia Care ?Procedure:                Pre-Anesthesia Assessment: ?                          - Prior to the procedure, a History and Physical  ?                          was performed, and patient medications and  ?                          allergies were reviewed. The patient's tolerance of  ?                          previous anesthesia was also reviewed. The risks  ?                          and benefits of the procedure and the sedation  ?                          options and risks were discussed with the patient.  ?                          All questions were answered, and informed consent  ?                          was obtained. Prior Anticoagulants: The patient has  ?                          taken no previous anticoagulant or antiplatelet  ?                          agents. ASA Grade Assessment: II - A patient with  ?                          mild systemic disease. After reviewing the risks  ?                          and benefits, the patient was deemed in  ?  satisfactory condition to undergo the procedure. ?                          After obtaining informed consent, the colonoscope  ?                          was passed under direct vision. Throughout the  ?                          procedure, the patient's blood pressure, pulse, and  ?                          oxygen saturations were monitored continuously. The  ?                          Olympus PCF-H190DL  (#[id number]) Colonoscope was  ?                          introduced through the anus and advanced to the  ?                          cecum, identified by appendiceal orifice and  ?                          ileocecal valve. The colonoscopy was performed  ?                          without difficulty. The patient tolerated the  ?                          procedure well. The quality of the bowel  ?                          preparation was good. The ileocecal valve,  ?                          appendiceal orifice, and rectum were photographed. ?Scope In: 3:59:49 PM ?Scope Out: 4:12:54 PM ?Scope Withdrawal Time: 0 hours 9 minutes 10 seconds  ?Total Procedure Duration: 0 hours 13 minutes 5 seconds  ?Findings:                 The digital rectal exam was normal. ?                          A 6 mm polyp was found in the transverse colon. The  ?                          polyp was sessile. The polyp was removed with a  ?                          cold snare. Resection and retrieval were complete. ?                          Multiple small and large-mouthed diverticula were  ?  found in the sigmoid colon and descending colon. ?                          Internal hemorrhoids were found during  ?                          retroflexion. The hemorrhoids were small. ?Complications:            No immediate complications. ?Estimated Blood Loss:     Estimated blood loss was minimal. ?Impression:               - One 6 mm polyp in the transverse colon, removed  ?                          with a cold snare. Resected and retrieved. ?                          - Diverticulosis in the sigmoid colon and in the  ?                          descending colon. ?                          - Internal hemorrhoids. ?Recommendation:           - Patient has a contact number available for  ?                          emergencies. The signs and symptoms of potential  ?                          delayed complications were discussed with the  ?                           patient. Return to normal activities tomorrow.  ?                          Written discharge instructions were provided to the  ?                          patient. ?                          - Resume previous diet. ?                          - Continue present medications. ?                          - Await pathology results. ?                          - Repeat colonoscopy may be recommended for  ?                          surveillance versus discontinuation of surveillance  ?  based on age. The decision about repeat colonoscopy  ?                          will be determined after pathology results from  ?                          today's exam become available for review. ?Jerene Bears, MD ?03/29/2022 4:22:37 PM ?This report has been signed electronically. ?

## 2022-03-29 NOTE — Progress Notes (Signed)
Sedate, gd SR, tolerated procedure well, VSS, report to RN Sedate, gd SR, tolerated procedure well, VSS, report to RN  

## 2022-03-29 NOTE — Progress Notes (Signed)
? ?GASTROENTEROLOGY PROCEDURE H&P NOTE  ? ?Primary Care Physician: ?Harrison Mons, PA ? ? ? ?Reason for Procedure:  Right upper quadrant abdominal pain, mild anorexia and heartburn, history of adenomatous and sessile serrated colon polyps ? ?Plan:    EGD and colonoscopy ? ?Patient is appropriate for endoscopic procedure(s) in the ambulatory ([facility]) setting. ? ?The nature of the procedure, as well as the risks, benefits, and alternatives were carefully and thoroughly reviewed with the patient. Ample time for discussion and questions allowed. The patient understood, was satisfied, and agreed to proceed.  ? ? ? ?HPI: ?Charlotte Williams is a 76 y.o. female who presents for EGD and colonoscopy.  Medical history as below.  Tolerated the prep.  No recent chest pain or shortness of breath.  No abdominal pain today. ? ?Past Medical History:  ?Diagnosis Date  ? Adenomatous colon polyp   ? Allergy   ? Arthritis   ? Diverticulosis   ? Internal hemorrhoids   ? Osteopenia   ? ? ?Past Surgical History:  ?Procedure Laterality Date  ? APPENDECTOMY    ? ROTATOR CUFF REPAIR Right   ? Bicept repair  ? TONSILLECTOMY AND ADENOIDECTOMY    ? ? ?Prior to Admission medications   ?Medication Sig Start Date End Date Taking? Authorizing Provider  ?amLODipine (NORVASC) 5 MG tablet Take 1 tablet by mouth daily. 01/04/22  Yes [provider]  ?azelastine (ASTELIN) 0.1 % nasal spray PLACE 1 SPRAY IN EACH NOSTRIL TWICE A DAY 12/02/18  Yes [provider]  ?LORazepam (ATIVAN) 0.5 MG tablet TAKE ONE TABLET BY MOUTH AT BEDTIME AS NEEDED FOR ANXIETY OR SLEEP 05/13/18  Yes Weber, Sarah L, PA-C  ?meloxicam (MOBIC) 7.5 MG tablet Take 7.5 mg by mouth 2 (two) times daily as needed. 11/06/21  Yes [provider]  ?pantoprazole (PROTONIX) 40 MG tablet Take 1 tablet (40 mg total) by mouth daily. 02/13/22  Yes Ayesha Markwell, Lajuan Lines, MD  ?benzonatate (TESSALON) 100 MG capsule TAKE 1 TO 2 CAPSULES BY MOUTH 3 TIMES A DAY AS NEEDED 01/29/19   [provider]  ?diclofenac sodium (VOLTAREN) 1 % GEL Apply 2 g topically 4 (four) times daily. Rub into affected area of foot 2 to 4 times daily 12/02/18   Trula Slade, DPM  ? ? ?Current Outpatient Medications  ?Medication Sig Dispense Refill  ? amLODipine (NORVASC) 5 MG tablet Take 1 tablet by mouth daily.    ? azelastine (ASTELIN) 0.1 % nasal spray PLACE 1 SPRAY IN EACH NOSTRIL TWICE A DAY    ? LORazepam (ATIVAN) 0.5 MG tablet TAKE ONE TABLET BY MOUTH AT BEDTIME AS NEEDED FOR ANXIETY OR SLEEP 30 tablet 0  ? meloxicam (MOBIC) 7.5 MG tablet Take 7.5 mg by mouth 2 (two) times daily as needed.    ? pantoprazole (PROTONIX) 40 MG tablet Take 1 tablet (40 mg total) by mouth daily. 90 tablet 0  ? benzonatate (TESSALON) 100 MG capsule TAKE 1 TO 2 CAPSULES BY MOUTH 3 TIMES A DAY AS NEEDED    ? diclofenac sodium (VOLTAREN) 1 % GEL Apply 2 g topically 4 (four) times daily. Rub into affected area of foot 2 to 4 times daily 100 g 2  ? ?Current Facility-Administered Medications  ?Medication Dose Route Frequency Provider Last Rate Last Admin  ? 0.9 %  sodium chloride infusion  500 mL Intravenous Once Khyron Garno, Lajuan Lines, MD      ? ? ?Allergies as of 03/29/2022 - Review Complete 03/29/2022  ?Allergen  Reaction Noted  ? Codeine Itching 08/17/2017  ? Sulfa antibiotics Itching 08/17/2017  ? ? ?Family History  ?Problem Relation Age of Onset  ? Endometriosis Daughter   ? Dementia Mother   ? Endometriosis Mother   ? Heart disease Father   ? Stroke Father   ? Arthritis Paternal Aunt   ? Rheum arthritis Paternal Aunt   ? Colon cancer Neg Hx   ? Esophageal cancer Neg Hx   ? Rectal cancer Neg Hx   ? Stomach cancer Neg Hx   ? ? ?Social History  ? ?Socioeconomic History  ? Marital status: Divorced  ?  Spouse name: Not on file  ? Number of children: 2  ? Years of education: Not on file  ? Highest education level: Bachelor's degree (e.g., BA, AB, BS)  ?Occupational History  ? Occupation: Realtor  ?Tobacco Use  ? Smoking status: Never  ?  Smokeless tobacco: Never  ?Vaping Use  ? Vaping Use: Never used  ?Substance and Sexual Activity  ? Alcohol use: Yes  ?  Alcohol/week: 7.0 standard drinks  ?  Types: 7 Standard drinks or equivalent per week  ?  Comment: one scotch each evening  ? Drug use: No  ? Sexual activity: Never  ?  Birth control/protection: Post-menopausal  ?Other Topics Concern  ? Not on file  ?Social History Narrative  ? Lives alone, with her cats.   ? Her daughter lives in [place], [place].  ? Her son lives in [place], [place].  ? ?Social Determinants of Health  ? ?Financial Resource Strain: Not on file  ?Food Insecurity: Not on file  ?Transportation Needs: Not on file  ?Physical Activity: Not on file  ?Stress: Not on file  ?Social Connections: Not on file  ?Intimate Partner Violence: Not on file  ? ? ?Physical Exam: ?Vital signs in last 24 hours: ?'@BP'$  (!) 147/106   Pulse 85   Temp 97.7 ?F (36.5 ?C)   Ht '5\' 2"'$  (1.575 m)   Wt 118 lb (53.5 kg)   SpO2 98%   BMI 21.58 kg/m?  ?GEN: NAD ?EYE: Sclerae anicteric ?ENT: MMM ?CV: Non-tachycardic ?Pulm: CTA b/l ?GI: Soft, NT/ND ?NEURO:  Alert & Oriented x 3 ? ? ?Zenovia Jarred, MD ?[facility] ? ?03/29/2022 3:43 PM ? ?

## 2022-03-29 NOTE — Op Note (Signed)
[facility] ?Patient Name: Charlotte Williams ?Procedure Date: 03/29/2022 3:39 PM ?MRN: [id number] ?Endoscopist: Jerene Bears , MD ?Age: 76 ?Referring MD:  ?Date of Birth: July 12, 1946 ?Gender: Female ?Account #: [id number] ?Procedure:                Upper GI endoscopy ?Indications:              Abdominal pain in the right upper quadrant,  ?                          Heartburn, Anorexia, reassuring recent abd/pelvis CT ?Medicines:                Monitored Anesthesia Care ?Procedure:                Pre-Anesthesia Assessment: ?                          - Prior to the procedure, a History and Physical  ?                          was performed, and patient medications and  ?                          allergies were reviewed. The patient's tolerance of  ?                          previous anesthesia was also reviewed. The risks  ?                          and benefits of the procedure and the sedation  ?                          options and risks were discussed with the patient.  ?                          All questions were answered, and informed consent  ?                          was obtained. Prior Anticoagulants: The patient has  ?                          taken no previous anticoagulant or antiplatelet  ?                          agents. ASA Grade Assessment: II - A patient with  ?                          mild systemic disease. After reviewing the risks  ?                          and benefits, the patient was deemed in  ?                          satisfactory condition to undergo the procedure. ?  After obtaining informed consent, the endoscope was  ?                          passed under direct vision. Throughout the  ?                          procedure, the patient's blood pressure, pulse, and  ?                          oxygen saturations were monitored continuously. The  ?                          Endoscope was introduced through the mouth, and  ?                          advanced to  the second part of duodenum. The upper  ?                          GI endoscopy was accomplished without difficulty.  ?                          The patient tolerated the procedure well. ?Scope In: ?Scope Out: ?Findings:                 Diffuse plaques were found in the middle third of  ?                          the esophagus and in the lower third of the  ?                          esophagus. ?                          A 3 cm hiatal hernia was present. ?                          The gastroesophageal flap valve was visualized  ?                          endoscopically and classified as Hill Grade IV (no  ?                          fold, wide open lumen, hiatal hernia present). ?                          The entire examined stomach was normal. Biopsies  ?                          were taken with a cold forceps for histology and  ?                          Helicobacter pylori testing. ?                          The examined duodenum  was normal. ?Complications:            No immediate complications. ?Estimated Blood Loss:     Estimated blood loss was minimal. ?Impression:               - Esophageal plaques were found, consistent with  ?                          candidiasis. ?                          - 3 cm hiatal hernia. ?                          - Normal stomach. Biopsied. ?                          - Normal examined duodenum. ?Recommendation:           - Patient has a contact number available for  ?                          emergencies. The signs and symptoms of potential  ?                          delayed complications were discussed with the  ?                          patient. Return to normal activities tomorrow.  ?                          Written discharge instructions were provided to the  ?                          patient. ?                          - Resume previous diet. ?                          - Continue present medications. ?                          - Begin fluconazole 200 mg x 1 day, 100 mg x 20   ?                          days for Candida esophagitis. ?                          - Await pathology results. ?                          - If RUQ pain persists after Candida treatment  ?                          please notify me by [facility] message or by calling  ?  my office. ?Jerene Bears, MD ?03/29/2022 4:19:46 PM ?This report has been signed electronically. ?

## 2022-03-29 NOTE — Patient Instructions (Signed)
Handouts for polyps, hiatal hernia and diverticulitis given. ? ?A RX was sent to your pharmacy for Fluconazole. ? ?YOU HAD AN ENDOSCOPIC PROCEDURE TODAY AT [facility]:   Refer to the procedure report that was given to you for any specific questions about what was found during the examination.  If the procedure report does not answer your questions, please call your gastroenterologist to clarify.  If you requested that your care partner not be given the details of your procedure findings, then the procedure report has been included in a sealed envelope for you to review at your convenience later. ? ?YOU SHOULD EXPECT: Some feelings of bloating in the abdomen. Passage of more gas than usual.  Walking can help get rid of the air that was put into your GI tract during the procedure and reduce the bloating. If you had a lower endoscopy (such as a colonoscopy or flexible sigmoidoscopy) you may notice spotting of blood in your stool or on the toilet paper. If you underwent a bowel prep for your procedure, you may not have a normal bowel movement for a few days. ? ?Please Note:  You might notice some irritation and congestion in your nose or some drainage.  This is from the oxygen used during your procedure.  There is no need for concern and it should clear up in a day or so. ? ?SYMPTOMS TO REPORT IMMEDIATELY: ? ?Following lower endoscopy (colonoscopy or flexible sigmoidoscopy): ? Excessive amounts of blood in the stool ? Significant tenderness or worsening of abdominal pains ? Swelling of the abdomen that is new, acute ? Fever of 100?F or higher ? ?Following upper endoscopy (EGD) ? Vomiting of blood or coffee ground material ? New chest pain or pain under the shoulder blades ? Painful or persistently difficult swallowing ? New shortness of breath ? Fever of 100?F or higher ? Black, tarry-looking stools ? ?For urgent or emergent issues, a gastroenterologist can be reached at any hour by calling [phone]. ?Do not use MyChart messaging for urgent concerns.  ? ? ?DIET:  We do recommend a small meal at first, but then you may proceed to your regular diet.  Drink plenty of fluids but you should avoid alcoholic beverages for 24 hours. ? ?ACTIVITY:  You should plan to take it easy for the rest of today and you should NOT DRIVE or use heavy machinery until tomorrow (because of the sedation medicines used during the test).   ? ?FOLLOW UP: ?Our staff will call the number listed on your records 48-72 hours following your procedure to check on you and address any questions or concerns that you may have regarding the information given to you following your procedure. If we do not reach you, we will leave a message.  We will attempt to reach you two times.  During this call, we will ask if you have developed any symptoms of COVID 19. If you develop any symptoms (ie: fever, flu-like symptoms, shortness of breath, cough etc.) before then, please call [phone].  If you test positive for Covid 19 in the 2 weeks post procedure, please call and report this information to [place].   ? ?If any biopsies were taken you will be contacted by phone or by letter within the next 1-3 weeks.  Please call us at [phone] if you have not heard about the biopsies in 3 weeks.  ? ? ?SIGNATURES/CONFIDENTIALITY: ?You and/or your care partner have signed paperwork which will be entered into  your electronic medical record.  These signatures attest to the fact that that the information above on your After Visit Summary has been reviewed and is understood.  Full responsibility of the confidentiality of this discharge information lies with you and/or your care-partner.  ?

## 2022-04-02 ENCOUNTER — Telehealth: Payer: Self-pay | Admitting: *Deleted

## 2022-04-02 NOTE — Telephone Encounter (Signed)
No answer on first attempt follow up call. Left message.  ?

## 2022-04-02 NOTE — Telephone Encounter (Signed)
No answer on second attempt follow up call.  ? ?

## 2022-04-04 ENCOUNTER — Encounter: Payer: Self-pay | Admitting: Internal Medicine

## 2022-04-09 ENCOUNTER — Other Ambulatory Visit: Payer: Self-pay | Admitting: Internal Medicine

## 2022-05-10 ENCOUNTER — Other Ambulatory Visit: Payer: Self-pay | Admitting: Internal Medicine

## 2022-07-17 ENCOUNTER — Telehealth: Payer: Self-pay | Admitting: Internal Medicine

## 2022-07-17 NOTE — Telephone Encounter (Signed)
Patient called, states pantoprazole 40 mg is not working for her. Would like something else to be call in for her. Requesting a call back. Please call to advise.

## 2022-07-17 NOTE — Telephone Encounter (Signed)
She was previously treated for Candida esophagitis after her endoscopy earlier this year If pantoprazole has stopped working please determine if the symptoms are reminiscent of prior complaints If so I would empirically retreat for Candida esophagitis with fluconazole 200 mg x 1 day and 100 mg x 13 days before we change PPI or do additional evaluation

## 2022-07-17 NOTE — Telephone Encounter (Signed)
Please advise regarding this patient

## 2022-07-17 NOTE — Telephone Encounter (Signed)
Pt states the protonix is helping for the heartburn. She states she needs something for her hiatal hernia. Reports she is having "attacks" when she cannot sit/stand/walk, she can only lay down. She thinks these attacks are related to foods but she can't pinpoint what she is eating that causes these episodes. Pt wants to know if there is anything that can be prescribed for her hernia.

## 2022-07-17 NOTE — Telephone Encounter (Signed)
Try the fluconazole again and if not helpful with that symptom then surgical repair consult can be done

## 2022-07-17 NOTE — Telephone Encounter (Signed)
Dr. Norman Herrlich Charlotte Williams states the protonix is not working for her and she is requesting something different be called into pharmacy. Please advise.

## 2022-07-18 ENCOUNTER — Other Ambulatory Visit: Payer: Self-pay

## 2022-07-18 MED ORDER — FLUCONAZOLE 100 MG PO TABS: ORAL_TABLET | ORAL | 0 refills | Status: DC

## 2022-07-18 NOTE — Telephone Encounter (Signed)
Spoke with pt and she is aware of Dr. Garth Schlatter recommendations. Prescription sent to pharmacy.

## 2022-08-07 ENCOUNTER — Other Ambulatory Visit: Payer: Self-pay | Admitting: Internal Medicine

## 2022-08-07 ENCOUNTER — Telehealth: Payer: Self-pay | Admitting: Internal Medicine

## 2022-08-07 NOTE — Telephone Encounter (Signed)
Inbound call from patient stating that she is having sharp pains and is not sure if it is coming from her hernia. Patient stated that it takes her breath away and she has to lay down to try and ease the pain. Patient was wanting to schedule an appointment with Dr. Hilarie Fredrickson and I advised that he did not have any appointments at time and offered an appointment with the PA. Patient stated that she wanted to stay with Dr. Hilarie Fredrickson and wanted to speak with the nurse. Please advise.

## 2022-08-07 NOTE — Telephone Encounter (Signed)
Pt states she is having "attacks" where she has pain at a 10 on a scale of 1-10 where she can"t sit up, she can't breathe well and she has to lie down. Pain is on her right side under her ribs. Pt still has her gallbladder but wonders if her hernia could be causing these symptoms. Would like to see Dr. Hilarie Fredrickson asap. Please advise.

## 2022-08-08 ENCOUNTER — Ambulatory Visit: Payer: Medicare PPO | Admitting: Physician Assistant

## 2022-08-08 ENCOUNTER — Other Ambulatory Visit: Payer: Self-pay

## 2022-08-08 DIAGNOSIS — R1011 Right upper quadrant pain: Secondary | ICD-10-CM

## 2022-08-08 NOTE — Telephone Encounter (Signed)
Left detailed message on pts identified voicemail of Dr. Vena Rua recommendations. Scheduled pt to see Nicoletta Ba PA today at 1:30pm. Lab orders in epic, order in for RUQ [place]. Rad scheduling will contact pt with appt for [place]. Left message for pt to call back to let us know if she can make the appt today at 2:30pm.  Pt states she just got home and checked her messages, she was at work and could not come today. She knows to come for labs and that she will get a call to schedule the RUQ [place]. Pt rescheduled to see Tye Savoy NP 08/30/22 at 1:30pm. Pt aware of appt.

## 2022-08-08 NOTE — Telephone Encounter (Signed)
Would encourage APP visit due to limited clinic time and availability for me CBC, CMP, lipase, amylase RUQ [place] ASAP eval gallbladder pathology and RUQ pain Any exertional chest pain, dyspnea with exertion, heartburn ER would be advised if pain 10/10 and unremitting

## 2022-08-09 ENCOUNTER — Other Ambulatory Visit (INDEPENDENT_AMBULATORY_CARE_PROVIDER_SITE_OTHER): Payer: Medicare PPO

## 2022-08-09 DIAGNOSIS — R1011 Right upper quadrant pain: Secondary | ICD-10-CM | POA: Diagnosis not present

## 2022-08-09 LAB — CBC WITH DIFFERENTIAL/PLATELET
Basophils Absolute: 0 10*3/uL (ref 0.0–0.1)
Basophils Relative: 0.6 % (ref 0.0–3.0)
Eosinophils Absolute: 0.2 10*3/uL (ref 0.0–0.7)
Eosinophils Relative: 2.1 % (ref 0.0–5.0)
HCT: 36.8 % (ref 36.0–46.0)
Hemoglobin: 12.6 g/dL (ref 12.0–15.0)
Lymphocytes Relative: 28.9 % (ref 12.0–46.0)
Lymphs Abs: 2.2 10*3/uL (ref 0.7–4.0)
MCHC: 34.3 g/dL (ref 30.0–36.0)
MCV: 92.2 fl (ref 78.0–100.0)
Monocytes Absolute: 0.4 10*3/uL (ref 0.1–1.0)
Monocytes Relative: 5.8 % (ref 3.0–12.0)
Neutro Abs: 4.7 10*3/uL (ref 1.4–7.7)
Neutrophils Relative %: 62.6 % (ref 43.0–77.0)
Platelets: 271 10*3/uL (ref 150.0–400.0)
RBC: 3.99 Mil/uL (ref 3.87–5.11)
RDW: 13 % (ref 11.5–15.5)
WBC: 7.4 10*3/uL (ref 4.0–10.5)

## 2022-08-09 LAB — COMPREHENSIVE METABOLIC PANEL
ALT: 15 U/L (ref 0–35)
AST: 21 U/L (ref 0–37)
Albumin: 3.9 g/dL (ref 3.5–5.2)
Alkaline Phosphatase: 55 U/L (ref 39–117)
BUN: 15 mg/dL (ref 6–23)
CO2: 28 mEq/L (ref 19–32)
Calcium: 9.2 mg/dL (ref 8.4–10.5)
Chloride: 100 mEq/L (ref 96–112)
Creatinine, Ser: 0.72 mg/dL (ref 0.40–1.20)
GFR: 82.07 mL/min (ref >60.00)
Glucose, Bld: 93 mg/dL (ref 70–99)
Potassium: 3.9 mEq/L (ref 3.5–5.1)
Sodium: 137 mEq/L (ref 135–145)
Total Bilirubin: 0.4 mg/dL (ref 0.2–1.2)
Total Protein: 6.5 g/dL (ref 6.0–8.3)

## 2022-08-09 LAB — AMYLASE: Amylase: 21 U/L — ABNORMAL LOW (ref 27–131)

## 2022-08-09 LAB — LIPASE: Lipase: 20 U/L (ref 11.0–59.0)

## 2022-08-13 ENCOUNTER — Ambulatory Visit (HOSPITAL_COMMUNITY)
Admission: RE | Admit: 2022-08-13 | Discharge: 2022-08-13 | Disposition: A | Discharge status: Home / Self Care | Payer: Medicare PPO | Source: Ambulatory Visit | Attending: Internal Medicine | Admitting: Internal Medicine

## 2022-08-13 DIAGNOSIS — R1011 Right upper quadrant pain: Secondary | ICD-10-CM | POA: Diagnosis not present

## 2022-08-16 ENCOUNTER — Encounter: Payer: Self-pay | Admitting: Medical

## 2022-08-16 ENCOUNTER — Ambulatory Visit: Payer: Medicare PPO | Admitting: Medical

## 2022-08-16 VITALS — BP 130/84 | HR 105 | Wt 119.2 lb

## 2022-08-16 DIAGNOSIS — Z23 Encounter for immunization: Secondary | ICD-10-CM

## 2022-08-16 DIAGNOSIS — B3781 Candidal esophagitis: Secondary | ICD-10-CM | POA: Diagnosis not present

## 2022-08-16 DIAGNOSIS — R059 Cough, unspecified: Secondary | ICD-10-CM

## 2022-08-16 DIAGNOSIS — R1011 Right upper quadrant pain: Secondary | ICD-10-CM

## 2022-08-16 NOTE — Progress Notes (Signed)
Subjective:  Charlotte Williams is a 76 y.o. female who presents for Chief Complaint  Patient presents with   new pt     New pt get established, pain is under right rib x started a year ago, bending hurts, or will flare up with food. And gas  Just had a bunch of testings done for this and everything has been normal. Pain 10/10 level sometimes. Takes meloxicam daily - over a year, could this cause it to flare up     Here as a new patient today.  Was seeing [facility] prior with Quay Burow, PA.   Wanted to try different office.  Medical Team: Dr. Hilarie Fredrickson, GI  She reports being healthy in general.  She eats a healthy diet, is active and exercising.  She avoids preservatives, does not eat junk food.  Her main concern is ongoing right upper quadrant pain for about a year.  She has seen gastroenterology and had scans as well as EGD and colonoscopy.  Just recently had EGD and colonoscopy in April.  Had some yeast in esophagus on endoscopy.  Took some medicaiton for 3 weeks for yeast.  After calling Dr. Hilarie Fredrickson about the cough, was put back on medication for another 3 weeks for yeast.  Gets pain and cough attacks.  The cough is sporadic.  She has had some cough today seem to be worse when she is lying down or after eating.  She is notes that GI did not think it was her gallbladder or the hiatal hernia that was found on endoscopy.  Using baking soda in water to alleviated the pain that seems to be gas and food related.  Worse after eating.  She denies any significant allergy problems.  No urinary or bowel issues otherwise  She does started taking a probiotic 2 days ago.  She has taken GERD medication chronically for the past year  No other aggravating or relieving factors.    No other c/o.  Past Medical History:  Diagnosis Date   Adenomatous colon polyp    Allergy    Arthritis    Diverticulosis    GERD (gastroesophageal reflux disease)    Hypertension    Internal hemorrhoids     Osteopenia    Current Outpatient Medications on File Prior to Visit  Medication Sig Dispense Refill   amLODipine (NORVASC) 5 MG tablet Take 1 tablet by mouth daily.     azelastine (ASTELIN) 0.1 % nasal spray PLACE 1 SPRAY IN EACH NOSTRIL TWICE A DAY     diclofenac sodium (VOLTAREN) 1 % GEL Apply 2 g topically 4 (four) times daily. Rub into affected area of foot 2 to 4 times daily 100 g 2   LORazepam (ATIVAN) 0.5 MG tablet TAKE ONE TABLET BY MOUTH AT BEDTIME AS NEEDED FOR ANXIETY OR SLEEP 30 tablet 0   meloxicam (MOBIC) 7.5 MG tablet Take 7.5 mg by mouth 2 (two) times daily as needed.     pantoprazole (PROTONIX) 40 MG tablet TAKE 1 TABLET BY MOUTH EVERY DAY 90 tablet 0   No current facility-administered medications on file prior to visit.     The following portions of the patient's history were reviewed and updated as appropriate: allergies, current medications, past family history, past medical history, past social history, past surgical history and problem list.  ROS Otherwise as in subjective above    Objective: BP 130/84   Pulse (!) 105   Wt 119 lb 3.2 oz (54.1 kg)   BMI 21.80  kg/m   General appearance: alert, no distress, well developed, well nourished HEENT: normocephalic, sclerae anicteric, conjunctiva pink and moist, TMs pearly, nares patent, no discharge or erythema, pharynx normal Oral cavity: MMM, no lesions Neck: supple, no lymphadenopathy, no thyromegaly, no masses Heart: RRR, normal S1, S2, no murmurs Lungs: CTA bilaterally, no wheezes, rhonchi, or rales Abdomen: +bs, soft, non tender, non distended, no masses, no hepatomegaly, no splenomegaly Pulses: 2+ radial pulses, 2+ pedal pulses, normal cap refill Ext: no edema No chest wall tenderness   Assessment: Encounter Diagnoses  Name Primary?   Esophageal candidiasis (HCC) Yes   Cough, unspecified type    Needs flu shot    RUQ pain      Plan: We discussed her symptoms which are little atypical and  unusual.  I reviewed EGD and colonoscopy she had done in May 2023, CT abdomen/pelvis 02/2022.    I reviewed the recent labs in the chart record  Differential could include ongoing candidiasis issue in the esophagus, GERD or indigestion, less likely hiatal hernia movement, less likely gallbladder but cannot rule this out.  We discussed that the candidiasis in general is an unusual finding.  Advised HIV baseline test.  I will lab person is not in the office this afternoon so she will come back for the HIV test.  I will also reach out to gastroenterology for feedback on this issue   Counseled on the influenza virus vaccine.  Vaccine information sheet given.   High dose Influenza vaccine given after consent obtained.   Grainne was seen today for new pt .  Diagnoses and all orders for this visit:  Esophageal candidiasis ([place]) -     HIV Antibody (routine testing w rflx); Future  Cough, unspecified type  Needs flu shot -     Flu Vaccine QUAD High Dose(Fluad)  RUQ pain    Follow up: Pending callback, lab

## 2022-08-17 ENCOUNTER — Other Ambulatory Visit: Payer: Medicare PPO

## 2022-08-17 DIAGNOSIS — B3781 Candidal esophagitis: Secondary | ICD-10-CM

## 2022-08-18 LAB — HIV ANTIBODY (ROUTINE TESTING W REFLEX): HIV Screen 4th Generation wRfx: NONREACTIVE

## 2022-08-20 ENCOUNTER — Telehealth: Payer: Self-pay | Admitting: Medical

## 2022-08-20 NOTE — Telephone Encounter (Signed)
I saw her other message but signed off on this before I could reply.   Glad to hear the probiotic is working.   I haven't yet heard back from GI

## 2022-08-20 NOTE — Telephone Encounter (Signed)
Pt called and states that she has been taking the probiotic since her visit last Thursday and states that it has been helping  She just wanted to inform you of this and also wanted to know if you heard back from the [facility] dr

## 2022-08-20 NOTE — Telephone Encounter (Signed)
Pt was notified.  

## 2022-08-22 NOTE — Progress Notes (Signed)
Pt coming in Monday.

## 2022-08-27 ENCOUNTER — Other Ambulatory Visit (INDEPENDENT_AMBULATORY_CARE_PROVIDER_SITE_OTHER): Payer: Medicare PPO

## 2022-08-27 DIAGNOSIS — Z136 Encounter for screening for cardiovascular disorders: Secondary | ICD-10-CM | POA: Diagnosis not present

## 2022-08-27 DIAGNOSIS — R0789 Other chest pain: Secondary | ICD-10-CM | POA: Diagnosis not present

## 2022-08-30 ENCOUNTER — Ambulatory Visit: Payer: Medicare PPO | Admitting: Nurse Practitioner

## 2022-08-30 ENCOUNTER — Encounter: Payer: Self-pay | Admitting: Nurse Practitioner

## 2022-08-30 VITALS — BP 130/74 | HR 89 | Ht 62.0 in | Wt 118.0 lb

## 2022-08-30 DIAGNOSIS — R1011 Right upper quadrant pain: Secondary | ICD-10-CM | POA: Diagnosis not present

## 2022-08-30 NOTE — Patient Instructions (Signed)
You will be contacted by [facility] in the next 2 days to arrange a Hida Scan.  The number on your caller ID will be [phone], please answer when they call.  If you have not heard from them in 2 days please call [phone] to schedule.     Use Scopolamine Transdermal Patches as directed on box. These can be purchase over the counter.    _______________________________________________________  If you are age 75 or older, your body mass index should be between 23-30. Your Body mass index is 21.58 kg/m. If this is out of the aforementioned range listed, please consider follow up with your Primary Care Provider.  If you are age 43 or younger, your body mass index should be between 19-25. Your Body mass index is 21.58 kg/m. If this is out of the aformentioned range listed, please consider follow up with your Primary Care Provider.   ________________________________________________________  The [facility] providers would like to encourage you to use [facility] to communicate with providers for non-urgent requests or questions.  Due to long hold times on the telephone, sending your provider a message by [facility] may be a faster and more efficient way to get a response.  Please allow 48 business hours for a response.  Please remember that this is for non-urgent requests.  _______________________________________________________  Thank you for choosing me and [facility].

## 2022-08-30 NOTE — Progress Notes (Signed)
Addendum: Reviewed and agree with assessment and management plan. Deloy Archey M, MD  

## 2022-08-30 NOTE — Progress Notes (Signed)
Chief Complaint:  abdominal pain    Assessment &  Plan   #76 year old female with chronic RUQ pain.  Extensive work-up including labs, CT scan and upper endoscopy were all nondiagnostic.  Her pain is confusing as there does seem to be both a GI and musculoskeletal component even though Carnett sign is negative.  Will proceed with HIDA as recommended by Dr. Hilarie Fredrickson.  If negative then I think we can confidently exclude biliary source of pain Pain has improved with some recent dietary measures.  I asked her to continue avoiding any foods / beverages she thinks may be triggers It is worth trying a Lidoderm patch to see if pain improves.  She will try Salonpas patches If HIDA is negative and Lidoderm patches do not help then consider a course of antibiotics for possible SIBO.  Though SIBO doesn't usually cause pain, she complains of excessive belching / flatulence and passage of gas helps the RUQ pain.   HPI   FRANKYE SCHWEGEL is a 76 y.o. female known to Dr. Hilarie Fredrickson with a past medical history significant for adenomatous and sessile serrated colon polyps, colonic diverticulosis, osteopenia . See PMH /PSH for additional history   02/13/22 office visit ( [facility]) Seen for evaluation of RUQ pain. Labs and CT scan obtained and unremarkable.  Scheduled for EGD / colonoscopy nondiagnostic.  She did have a 3 cm hiatal hernia and esophageal candida  08/07/22 Called office  Call from patient.  She was having sharp abdominal pain, 10 out 10 on pain scale. Labs ordered, appt given.  Dr. Hilarie Fredrickson requested labs and RUQ [place].  CBC, c-Met, lipase were normal. RUQ without acute findings.   Interval History:  Sincere tells me the attacks of RUQ pain she was having when she made this appointment are the same as when she saw Dr. Hilarie Fredrickson in March.    RUQ doesn't radiate through to her back. Pain can occur on empty stomach ifs she has a drink of Etoh. Also occurs after eating.  Bending nor twisting exacerbate the RUQ pain  though she can feel the pain when stretching.  She also complains of excessive belching and flatulence. Belching and passage of flatus help the RUQ pain.  Her bowel movements are normal.  She has at least 1 bowel movement every day  Over the last two weeks she has stopped Etoh, mint and club soda and the pain has significantly improved. No "attack" of RUQ pain but can still feel discomfort when stretching.  Since making dietary changes she hasn't been having any GERD symptoms and stopped PPI two weeks ago.   Previous GI Evaluation  May 2023 EGD and colonoscopy  - Esophageal plaques were found, consistent with candidiasis. - 3 cm hiatal hernia. - Normal stomach. Biopsied. - Normal examined duodenum.  May 2023 colonoscopy  - One 6 mm polyp in the transverse colon, removed with a cold snare. Resected and retrieved. - Diverticulosis in the sigmoid colon and in the descending colon. - Internal hemorrhoids.  Surgical [P], gastric antrum and gastric body - GASTRIC OXYNTIC MUCOSA WITH PARIETAL CELL HYPERPLASIA AS CAN BE SEEN IN HYPERGASTRINEMIC STATES SUCH AS PPI THERAPY. - GASTRIC ANTRAL MUCOSA WITH NO SPECIFIC HISTOPATHOLOGIC CHANGES - HELICOBACTER PYLORI-LIKE ORGANISMS ARE NOT IDENTIFIED ON ROUTINE H&E STAIN 2. Surgical [P], colon, transverse, polyp (1) - SESSILE SERRATED POLYP(S) WITHOUT CYTOLOGIC DYSPLASIA  Imaging   RUQ [place] CLINICAL DATA:  Abdominal pain for six-months.  Assess gallbladder.   EXAM: ULTRASOUND ABDOMEN LIMITED RIGHT  UPPER QUADRANT   COMPARISON:  CT abdomen and pelvis March 16, 2022, abdomen ultrasound August 30, 2017   FINDINGS: Gallbladder:   No gallstones or wall thickening visualized. No sonographic Murphy sign noted by sonographer.   Common bile duct:   Diameter: 3 mm   Liver:   There is a 1.3 x 0.4 x 0.7 cm cyst in the left lobe liver noted on prior CT and ultrasound. Within normal limits in parenchymal echogenicity. Portal vein is patent on color  Doppler imaging with normal direction of blood flow towards the liver.   Other: None.   IMPRESSION: No acute abnormality. No evidence of acute cholecystitis.     Electronically Signed   By: Abelardo Diesel M.D.   On: 08/13/2022 09:03    Labs:     Latest Ref Rng & Units 08/09/2022    3:08 PM 08/17/2017    9:04 AM 05/11/2008   11:27 AM  CBC  WBC 4.0 - 10.5 K/uL 7.4  9.1    Hemoglobin 12.0 - 15.0 g/dL 12.6  15.2  13.2   Hematocrit 36.0 - 46.0 % 36.8  44.1    Platelets 150.0 - 400.0 K/uL 271.0          Latest Ref Rng & Units 08/09/2022    3:08 PM 02/13/2022    2:56 PM 08/17/2017    9:29 AM  Hepatic Function  Total Protein 6.0 - 8.3 g/dL 6.5  6.7  6.9   Albumin 3.5 - 5.2 g/dL 3.9  4.3  5.1   AST 0 - 37 U/L '21  22  28   ' ALT 0 - 35 U/L '15  19  22   ' Alk Phosphatase 39 - 117 U/L 55  54  46   Total Bilirubin 0.2 - 1.2 mg/dL 0.4  0.5  0.4      Past Medical History:  Diagnosis Date   Adenomatous colon polyp    Allergy    Arthritis    Diverticulosis    GERD (gastroesophageal reflux disease)    Hypertension    Internal hemorrhoids    Osteopenia     Past Surgical History:  Procedure Laterality Date   APPENDECTOMY     ROTATOR CUFF REPAIR Right    Bicept repair   TONSILLECTOMY AND ADENOIDECTOMY      Current Medications, Allergies, Family History and Social History were reviewed in [employer] record.     Current Outpatient Medications  Medication Sig Dispense Refill   amLODipine (NORVASC) 5 MG tablet Take 1 tablet by mouth daily.     azelastine (ASTELIN) 0.1 % nasal spray PLACE 1 SPRAY IN EACH NOSTRIL TWICE A DAY     diclofenac sodium (VOLTAREN) 1 % GEL Apply 2 g topically 4 (four) times daily. Rub into affected area of foot 2 to 4 times daily 100 g 2   LORazepam (ATIVAN) 0.5 MG tablet TAKE ONE TABLET BY MOUTH AT BEDTIME AS NEEDED FOR ANXIETY OR SLEEP 30 tablet 0   meloxicam (MOBIC) 7.5 MG tablet Take 7.5 mg by mouth 2 (two) times daily as needed.      pantoprazole (PROTONIX) 40 MG tablet TAKE 1 TABLET BY MOUTH EVERY DAY 90 tablet 0   No current facility-administered medications for this visit.    Review of Systems: No chest pain. No shortness of breath. No urinary complaints.    Physical Exam  Wt Readings from Last 3 Encounters:  08/16/22 119 lb 3.2 oz (54.1 kg)  03/29/22 118 lb (53.5  kg)  02/13/22 118 lb (53.5 kg)    BP 130/74   Pulse 89   Ht '5\' 2"'  (1.575 m)   Wt 118 lb (53.5 kg)   BMI 21.58 kg/m  Constitutional:  Generally well appearing female in no acute distress. Psychiatric: Pleasant. Normal mood and affect. Behavior is normal. EENT: Pupils normal.  Conjunctivae are normal. No scleral icterus. Neck supple.  Cardiovascular: Normal rate, regular rhythm. No edema Pulmonary/chest: Effort normal and breath sounds normal. No wheezing, rales or rhonchi. Abdominal: Soft, nondistended, nontender. Bowel sounds active throughout. There are no masses palpable. No hepatomegaly. Neurological: Alert and oriented to person place and time. Skin: Skin is warm and dry. No rashes noted.  Tye Savoy, NP  08/30/2022, 11:55 AM

## 2022-09-04 ENCOUNTER — Encounter: Payer: Self-pay | Admitting: Internal Medicine

## 2022-09-21 ENCOUNTER — Encounter: Payer: Self-pay | Admitting: Medical

## 2022-09-21 ENCOUNTER — Telehealth (INDEPENDENT_AMBULATORY_CARE_PROVIDER_SITE_OTHER): Payer: Medicare PPO | Admitting: Medical

## 2022-09-21 VITALS — Temp 98.7°F | Wt 118.0 lb

## 2022-09-21 DIAGNOSIS — J988 Other specified respiratory disorders: Secondary | ICD-10-CM

## 2022-09-21 DIAGNOSIS — R058 Other specified cough: Secondary | ICD-10-CM

## 2022-09-21 MED ORDER — BENZONATATE 200 MG PO CAPS: 200.0000 mg | ORAL_CAPSULE | Freq: Three times a day (TID) | ORAL | 0 refills | Status: DC | PRN

## 2022-09-21 MED ORDER — CLARITHROMYCIN 500 MG PO TABS: 500.0000 mg | ORAL_TABLET | Freq: Two times a day (BID) | ORAL | 0 refills | Status: DC

## 2022-09-21 NOTE — Progress Notes (Signed)
Subjective:     Patient ID: Charlotte Williams, female   DOB: 1946-04-07, 76 y.o.   MRN: [id number]  This visit type was conducted due to national recommendations for restrictions regarding the COVID-19 Pandemic (e.g. social distancing) in an effort to limit this patient's exposure and mitigate transmission in our community.  Due to their co-morbid illnesses, this patient is at least at moderate risk for complications without adequate follow up.  This format is felt to be most appropriate for this patient at this time.    Documentation for virtual audio and video telecommunications through [facility] encounter:  The patient was located at home. The provider was located in the office. The patient did consent to this visit and is aware of possible charges through their insurance for this visit.  The other persons participating in this telemedicine service were none. Time spent on call was 20 minutes and in review of previous records 20 minutes total.  This virtual service is not related to other E/M service within previous 7 days.   HPI Chief Complaint  Patient presents with   cough    Cough and congestion- netipot- does wonders, mucous and drainage in through x couple weeks. Negative covid, needs refills on meds   Virtual consult.  Gets illness spring and fall yearly.   She notes cough, chest congestion, mucous drainage.  Been using neti pot, mucous DM extended release, guaifenesin.  Works in [employer], [employer].  Missed 2 days recently due to this illness. Feels fatigue.  Cant' get rid of cough.    No fever, no NVD, but no appetite. No SOB or wheezing.    No ear pain, no sore throat.  No sick contacts.  Did 2 covid tests last week that were negative.  Has cut out alcohol and mint tea, awaiting HIDA scan.    No other aggravating or relieving factors. No other complaint.   Past Medical History:  Diagnosis Date   Adenomatous colon polyp    Allergy    Arthritis    Diverticulosis    GERD  (gastroesophageal reflux disease)    Hypertension    Internal hemorrhoids    Osteopenia    Current Outpatient Medications on File Prior to Visit  Medication Sig Dispense Refill   amLODipine (NORVASC) 5 MG tablet Take 1 tablet by mouth daily.     azelastine (ASTELIN) 0.1 % nasal spray PLACE 1 SPRAY IN EACH NOSTRIL TWICE A DAY     meloxicam (MOBIC) 7.5 MG tablet Take 7.5 mg by mouth daily.     diclofenac sodium (VOLTAREN) 1 % GEL Apply 2 g topically 4 (four) times daily. Rub into affected area of foot 2 to 4 times daily (Patient not taking: Reported on 09/21/2022) 100 g 2   LORazepam (ATIVAN) 0.5 MG tablet TAKE ONE TABLET BY MOUTH AT BEDTIME AS NEEDED FOR ANXIETY OR SLEEP (Patient not taking: Reported on 09/21/2022) 30 tablet 0   pantoprazole (PROTONIX) 40 MG tablet TAKE 1 TABLET BY MOUTH EVERY DAY (Patient not taking: Reported on 08/30/2022) 90 tablet 0   No current facility-administered medications on file prior to visit.    Review of Systems As in subjective    Objective:   Physical Exam Due to coronavirus pandemic stay at home measures, patient visit was virtual and they were not examined in person.   Temp 98.7 F (37.1 C)   Wt 118 lb (53.5 kg)   BMI 21.58 kg/m   Gen: wd, wn, nad No obvious wheezing, no labored  breathing    Assessment:     Encounter Diagnoses  Name Primary?   Cough productive of purulent sputum Yes   Respiratory tract infection        Plan:     Discussed symptoms and concerns, limitations of virtual consult.  Symptoms suggest sinobronchitis possibly.  Begin medication as below, rest, hydrate well, continue Neti pot.  If not much improved in 3 to 5 days to let me know  Glad to hear she is improving in her abdominal symptoms since cutting back on scotch and alcohol.  She is awaiting HIDA scan coming up soon.  Mavery was seen today for cough.  Diagnoses and all orders for this visit:  Cough productive of purulent sputum  Respiratory tract  infection  Other orders -     clarithromycin (BIAXIN) 500 MG tablet; Take 1 tablet (500 mg total) by mouth 2 (two) times daily. -     benzonatate (TESSALON) 200 MG capsule; Take 1 capsule (200 mg total) by mouth 3 (three) times daily as needed for cough.  F/u prn

## 2022-09-24 ENCOUNTER — Telehealth: Payer: Self-pay | Admitting: Nurse Practitioner

## 2022-09-24 NOTE — Telephone Encounter (Signed)
Patient called would like to have her appt at [facility] for Hepato canceled, she is feeling a lot better.

## 2022-09-24 NOTE — Telephone Encounter (Signed)
Pt stated that her pain has improved and since she feels better she wanted to cancel HIDA scan. HIDA scan canceled. Pt stated she would call back if pain returned.

## 2022-09-27 ENCOUNTER — Telehealth: Payer: Self-pay | Admitting: Medical

## 2022-09-27 NOTE — Telephone Encounter (Signed)
Spoke with to schedule awv.  She stated she needs a refill on  amlodipine  [facility] [address]  She has 16 days left

## 2022-09-27 NOTE — Telephone Encounter (Signed)
Left message for patient to call back and schedule Medicare Annual Wellness Visit (AWV) either virtually or in office. Left  my Herbie Drape number [phone]   Last AWV  10/22/17 please schedule with Nurse Health Adviser   45 min for awv-i and in office appointments 30 min for awv-s  phone/virtual appointments

## 2022-09-28 ENCOUNTER — Other Ambulatory Visit: Payer: Self-pay | Admitting: Medical

## 2022-09-28 MED ORDER — AMLODIPINE BESYLATE 5 MG PO TABS: 5.0000 mg | ORAL_TABLET | Freq: Every day | ORAL | 0 refills | Status: DC

## 2022-10-05 ENCOUNTER — Ambulatory Visit (INDEPENDENT_AMBULATORY_CARE_PROVIDER_SITE_OTHER): Payer: Medicare PPO

## 2022-10-05 ENCOUNTER — Other Ambulatory Visit (HOSPITAL_COMMUNITY): Payer: Medicare PPO

## 2022-10-05 VITALS — Ht 62.5 in | Wt 118.0 lb

## 2022-10-05 DIAGNOSIS — Z0000 Encounter for general adult medical examination without abnormal findings: Secondary | ICD-10-CM | POA: Diagnosis not present

## 2022-10-05 NOTE — Progress Notes (Signed)
I connected with Charlotte Williams today by telephone and verified that I am speaking with the correct person using two identifiers. Location patient: home Location provider: work Persons participating in the virtual visit: Moira Umholtz, Glenna Durand LPN.   I discussed the limitations, risks, security and privacy concerns of performing an evaluation and management service by telephone and the availability of in person appointments. I also discussed with the patient that there may be a patient responsible charge related to this service. The patient expressed understanding and verbally consented to this telephonic visit.    Interactive audio and video telecommunications were attempted between this provider and patient, however failed, due to patient having technical difficulties OR patient did not have access to video capability.  We continued and completed visit with audio only.     Vital signs may be patient reported or missing.  Subjective:   Charlotte Williams is a 76 y.o. female who presents for Medicare Annual (Subsequent) preventive examination.  Review of Systems     Cardiac Risk Factors include: advanced age (>81mn, >>75women);dyslipidemia     Objective:    Today's Vitals   10/05/22 1426  Weight: 118 lb (53.5 kg)  Height: 5' 2.5" (1.588 m)   Body mass index is 21.24 kg/m.     10/05/2022    2:31 PM 10/22/2017    8:53 AM  Advanced Directives  Does Patient Have a Medical Advance Directive? Yes Yes  Type [place] Living will  Does patient want to make changes to medical advance directive?  Yes (ED - Information included in AVS)  [place]? No - copy requested     Current Medications (verified) Outpatient Encounter Medications as of 10/05/2022  Medication Sig   amLODipine (NORVASC) 5 MG tablet Take 1 tablet (5 mg total) by mouth daily.   azelastine (ASTELIN) 0.1 % nasal spray PLACE 1  SPRAY IN EACH NOSTRIL TWICE A DAY   benzonatate (TESSALON) 200 MG capsule Take 1 capsule (200 mg total) by mouth 3 (three) times daily as needed for cough.   latanoprost (XALATAN) 0.005 % ophthalmic solution 1 drop at bedtime.   meloxicam (MOBIC) 7.5 MG tablet Take 7.5 mg by mouth daily.   clarithromycin (BIAXIN) 500 MG tablet Take 1 tablet (500 mg total) by mouth 2 (two) times daily. (Patient not taking: Reported on 10/05/2022)   diclofenac sodium (VOLTAREN) 1 % GEL Apply 2 g topically 4 (four) times daily. Rub into affected area of foot 2 to 4 times daily (Patient not taking: Reported on 09/21/2022)   LORazepam (ATIVAN) 0.5 MG tablet TAKE ONE TABLET BY MOUTH AT BEDTIME AS NEEDED FOR ANXIETY OR SLEEP (Patient not taking: Reported on 09/21/2022)   pantoprazole (PROTONIX) 40 MG tablet TAKE 1 TABLET BY MOUTH EVERY DAY (Patient not taking: Reported on 08/30/2022)   No facility-administered encounter medications on file as of 10/05/2022.    Allergies (verified) Codeine and Sulfa antibiotics   History: Past Medical History:  Diagnosis Date   Adenomatous colon polyp    Allergy    Arthritis    Diverticulosis    GERD (gastroesophageal reflux disease)    Hypertension    Internal hemorrhoids    Osteopenia    Past Surgical History:  Procedure Laterality Date   APPENDECTOMY     ROTATOR CUFF REPAIR Right    Bicept repair   TONSILLECTOMY AND ADENOIDECTOMY     Family History  Problem Relation Age  of Onset   Endometriosis Daughter    Dementia Mother    Endometriosis Mother    Heart disease Father    Stroke Father    Arthritis Paternal Aunt    Rheum arthritis Paternal Aunt    Colon cancer Neg Hx    Esophageal cancer Neg Hx    Rectal cancer Neg Hx    Stomach cancer Neg Hx    Social History   Socioeconomic History   Marital status: Divorced    Spouse name: Not on file   Number of children: 2   Years of education: Not on file   Highest education level: Bachelor's degree (e.g., BA,  AB, BS)  Occupational History   Occupation: Realtor  Tobacco Use   Smoking status: Never   Smokeless tobacco: Never  Vaping Use   Vaping Use: Never used  Substance and Sexual Activity   Alcohol use: Not Currently    Alcohol/week: 7.0 standard drinks of alcohol    Types: 7 Standard drinks or equivalent per week    Comment: one scotch each evening   Drug use: No   Sexual activity: Never    Birth control/protection: Post-menopausal  Other Topics Concern   Not on file  Social History Narrative   Lives alone, with her cats.    Her daughter lives in [place], [place].   Her son lives in [place], [place].   Eats healthy, cooks at home, doesn't eat sweets, preservatives   Social Determinants of Health   Financial Resource Strain: Low Risk  (10/05/2022)   Overall Financial Resource Strain (CARDIA)    Difficulty of Paying Living Expenses: Not hard at all  Food Insecurity: No Food Insecurity (10/05/2022)   Hunger Vital Sign    Worried About Running Out of Food in the Last Year: Never true    Ran Out of Food in the Last Year: Never true  Transportation Needs: No Transportation Needs (10/05/2022)   PRAPARE - Hydrologist (Medical): No    Lack of Transportation (Non-Medical): No  Physical Activity: Inactive (10/05/2022)   Exercise Vital Sign    Days of Exercise per Week: 0 days    Minutes of Exercise per Session: 0 min  Stress: No Stress Concern Present (10/05/2022)   [place]    Feeling of Stress : Not at all  Social Connections: Somewhat Isolated (10/22/2017)   Social Connection and Isolation Panel [NHANES]    Frequency of Communication with Friends and Family: More than three times a week    Frequency of Social Gatherings with Friends and Family: More than three times a week    Attends Religious Services: More than 4 times per year    Active Member of [employer] or Organizations: No    Attends  Archivist Meetings: Never    Marital Status: Divorced    Tobacco Counseling Counseling given: Not Answered   Clinical Intake:  Pre-visit preparation completed: Yes  Pain : No/denies pain     Nutritional Status: BMI of 19-24  Normal Nutritional Risks: None Diabetes: No  How often do you need to have someone help you when you read instructions, pamphlets, or other written materials from your doctor or pharmacy?: 1 - Never  Diabetic? no  Interpreter Needed?: No  Information entered by :: NAllen LPN   Activities of Daily Living    10/05/2022    2:34 PM  In your present state of health, do you have any  difficulty performing the following activities:  Hearing? 0  Vision? 0  Difficulty concentrating or making decisions? 0  Walking or climbing stairs? 0  Dressing or bathing? 0  Doing errands, shopping? 0  Preparing Food and eating ? N  Using the Toilet? N  In the past six months, have you accidently leaked urine? Y  Comment if held too long  Do you have problems with loss of bowel control? N  Managing your Medications? N  Managing your Finances? N  Housekeeping or managing your Housekeeping? N    Patient Care Team: Tysinger, Camelia Eng, PA-C as PCP - General (Family Medicine)  Indicate any recent Medical Services you may have received from other than [facility] providers in the past year (date may be approximate).     Assessment:   This is a routine wellness examination for Charlotte Williams.  Hearing/Vision screen Vision Screening - Comments:: Regular eye exams, [facility]  Dietary issues and exercise activities discussed: Current Exercise Habits: The patient has a physically strenuous job, but has no regular exercise apart from work.   Goals Addressed             This Visit's Progress    Patient Stated       10/05/2022, wants to get rid of tummy       Depression Screen    10/05/2022    2:33 PM 08/16/2022    3:52 PM 11/20/2017   10:15 AM  10/22/2017    8:52 AM 08/20/2017    9:18 AM 08/17/2017    8:30 AM  PHQ 2/9 Scores  PHQ - 2 Score 0 0 0 0 0 0  PHQ- 9 Score 0         Fall Risk    10/05/2022    2:33 PM 08/16/2022    3:52 PM 11/20/2017   10:15 AM 10/22/2017    8:52 AM 08/20/2017    9:18 AM  Sophia in the past year? 0 0 No No No  Number falls in past yr: 0 0     Injury with Fall? 0 0     Risk for fall due to : Medication side effect No Fall Risks     Follow up Falls prevention discussed;Education provided;Falls evaluation completed Falls evaluation completed       FALL RISK PREVENTION PERTAINING TO THE HOME:  Any stairs in or around the home? Yes  If so, are there any without handrails? No  Home free of loose throw rugs in walkways, pet beds, electrical cords, etc? Yes  Adequate lighting in your home to reduce risk of falls? Yes   ASSISTIVE DEVICES UTILIZED TO PREVENT FALLS:  Life alert? No  Use of a cane, walker or w/c? No  Grab bars in the bathroom? Yes  Shower chair or bench in shower? Yes  Elevated toilet seat or a handicapped toilet? Yes   TIMED UP AND GO:  Was the test performed? No .      Cognitive Function:        10/05/2022    2:35 PM  6CIT Screen  What Year? 0 points  What month? 0 points  What time? 0 points  Count back from 20 0 points  Months in reverse 0 points  Repeat phrase 2 points  Total Score 2 points    Immunizations Immunization History  Administered Date(s) Administered   Fluad Quad(high Dose 65+) 08/16/2022   Influenza,inj,Quad PF,6+ Mos 08/20/2017   PFIZER Comirnaty(Gray Top)Covid-19 Tri-Sucrose Vaccine  07/10/2021   PFIZER(Purple Top)SARS-COV-2 Vaccination 10/11/2020   PNEUMOCOCCAL CONJUGATE-20 07/10/2021, 09/11/2021   Pfizer Covid-19 Vaccine Bivalent Booster 40yr & up 09/11/2021    TDAP status: Due, Education has been provided regarding the importance of this vaccine. Advised may receive this vaccine at local pharmacy or Health Dept. Aware to  provide a copy of the vaccination record if obtained from local pharmacy or Health Dept. Verbalized acceptance and understanding.  Flu Vaccine status: Up to date  Pneumococcal vaccine status: Up to date  Covid-19 vaccine status: Completed vaccines  Qualifies for Shingles Vaccine? Yes   Zostavax completed No   Shingrix Completed?: No.    Education has been provided regarding the importance of this vaccine. Patient has been advised to call insurance company to determine out of pocket expense if they have not yet received this vaccine. Advised may also receive vaccine at local pharmacy or Health Dept. Verbalized acceptance and understanding.  Screening Tests Health Maintenance  Topic Date Due   TETANUS/TDAP  Never done   Zoster Vaccines- Shingrix (1 of 2) Never done   Medicare Annual Wellness (AWV)  01/10/2022   COVID-19 Vaccine (6 - Pfizer series) 01/12/2022   Pneumonia Vaccine 75 Years old  Completed   INFLUENZA VACCINE  Completed   DEXA SCAN  Completed   Hepatitis C Screening  Completed   HPV VACCINES  Aged Out   COLONOSCOPY (Pts [id number] coverage will need to be confirmed)  Discontinued    Health Maintenance  Health Maintenance Due  Topic Date Due   TETANUS/TDAP  Never done   Zoster Vaccines- Shingrix (1 of 2) Never done   Medicare Annual Wellness (AWV)  01/10/2022   COVID-19 Vaccine (6 - Pfizer series) 01/12/2022    Colorectal cancer screening: Type of screening: Colonoscopy. Completed 03/29/2022. Repeat every 5 years  Mammogram status: not required  Bone Density status: Completed 01/28/2017.   Lung Cancer Screening: (Low Dose CT Chest recommended if Age 75-80ears, 30 pack-year currently smoking OR have quit w/in 15years.) does not qualify.   Lung Cancer Screening Referral: no  Additional Screening:  Hepatitis C Screening: does qualify; Completed 01/10/2017  Vision Screening: Recommended annual ophthalmology exams for early detection of glaucoma and other  disorders of the eye. Is the patient up to date with their annual eye exam?  Yes  Who is the provider or what is the name of the office in which the patient attends annual eye exams? [facility] If pt is not established with a provider, would they like to be referred to a provider to establish care? No .   Dental Screening: Recommended annual dental exams for proper oral hygiene  Community Resource Referral / Chronic Care Management: CRR required this visit?  No   CCM required this visit?  No      Plan:     I have personally reviewed and noted the following in the patient's chart:   Medical and social history Use of alcohol, tobacco or illicit drugs  Current medications and supplements including opioid prescriptions. Patient is not currently taking opioid prescriptions. Functional ability and status Nutritional status Physical activity Advanced directives List of other physicians Hospitalizations, surgeries, and ER visits in previous 12 months Vitals Screenings to include cognitive, depression, and falls Referrals and appointments  In addition, I have reviewed and discussed with patient certain preventive protocols, quality metrics, and best practice recommendations. A written personalized care plan for preventive services as well as general preventive health recommendations were provided to  patient.     Kellie Simmering, LPN   [id number]/[id number]   Nurse Notes: none  Due to this being a virtual visit, the after visit summary with patients personalized plan was offered to patient via mail or my-chart.  Patient would like to access on my-chart

## 2022-10-05 NOTE — Patient Instructions (Signed)
Ms. Charlotte Williams , Thank you for taking time to come for your Medicare Wellness Visit. I appreciate your ongoing commitment to your health goals. Please review the following plan we discussed and let me know if I can assist you in the future.   Screening recommendations/referrals: Colonoscopy: completed 03/29/2022, due 03/30/2027 Mammogram: patient to schedule Bone Density: completed 01/28/2017 Recommended yearly ophthalmology/optometry visit for glaucoma screening and checkup Recommended yearly dental visit for hygiene and checkup  Vaccinations: Influenza vaccine: completed 08/16/2022 Pneumococcal vaccine: completed 09/11/2021 Tdap vaccine: due Shingles vaccine: discussed   Covid-19: 09/11/2021, 07/10/2021, 10/11/2020, 01/29/2020, 12/29/2019  Advanced directives: Please bring a copy of your POA (Power of Attorney) and/or Living Will to your next appointment.   Conditions/risks identified: none  Next appointment: Follow up in one year for your annual wellness visit    Preventive Care 65 Years and Older, Female Preventive care refers to lifestyle choices and visits with your health care provider that can promote health and wellness. What does preventive care include? A yearly physical exam. This is also called an annual well check. Dental exams once or twice a year. Routine eye exams. Ask your health care provider how often you should have your eyes checked. Personal lifestyle choices, including: Daily care of your teeth and gums. Regular physical activity. Eating a healthy diet. Avoiding tobacco and drug use. Limiting alcohol use. Practicing safe sex. Taking low-dose aspirin every day. Taking vitamin and mineral supplements as recommended by your health care provider. What happens during an annual well check? The services and screenings done by your health care provider during your annual well check will depend on your age, overall health, lifestyle risk factors, and family history of  disease. Counseling  Your health care provider may ask you questions about your: Alcohol use. Tobacco use. Drug use. Emotional well-being. Home and relationship well-being. Sexual activity. Eating habits. History of falls. Memory and ability to understand (cognition). Work and work Statistician. Reproductive health. Screening  You may have the following tests or measurements: Height, weight, and BMI. Blood pressure. Lipid and cholesterol levels. These may be checked every 5 years, or more frequently if you are over 16 years old. Skin check. Lung cancer screening. You may have this screening every year starting at age 79 if you have a 30-pack-year history of smoking and currently smoke or have quit within the past 15 years. Fecal occult blood test (FOBT) of the stool. You may have this test every year starting at age 75. Flexible sigmoidoscopy or colonoscopy. You may have a sigmoidoscopy every 5 years or a colonoscopy every 10 years starting at age 51. Hepatitis C blood test. Hepatitis B blood test. Sexually transmitted disease (STD) testing. Diabetes screening. This is done by checking your blood sugar (glucose) after you have not eaten for a while (fasting). You may have this done every 1-3 years. Bone density scan. This is done to screen for osteoporosis. You may have this done starting at age 64. Mammogram. This may be done every 1-2 years. Talk to your health care provider about how often you should have regular mammograms. Talk with your health care provider about your test results, treatment options, and if necessary, the need for more tests. Vaccines  Your health care provider may recommend certain vaccines, such as: Influenza vaccine. This is recommended every year. Tetanus, diphtheria, and acellular pertussis (Tdap, Td) vaccine. You may need a Td booster every 10 years. Zoster vaccine. You may need this after age 75. Pneumococcal 13-valent conjugate (PCV13) vaccine.  One  dose is recommended after age 6. Pneumococcal polysaccharide (PPSV23) vaccine. One dose is recommended after age 77. Talk to your health care provider about which screenings and vaccines you need and how often you need them. This information is not intended to replace advice given to you by your health care provider. Make sure you discuss any questions you have with your health care provider. Document Released: 12/09/2015 Document Revised: 08/01/2016 Document Reviewed: 09/13/2015 Elsevier Interactive Patient Education  2017 [place] Prevention in the Home Falls can cause injuries. They can happen to people of all ages. There are many things you can do to make your home safe and to help prevent falls. What can I do on the outside of my home? Regularly fix the edges of walkways and driveways and fix any cracks. Remove anything that might make you trip as you walk through a door, such as a raised step or threshold. Trim any bushes or trees on the path to your home. Use bright outdoor lighting. Clear any walking paths of anything that might make someone trip, such as rocks or tools. Regularly check to see if handrails are loose or broken. Make sure that both sides of any steps have handrails. Any raised decks and porches should have guardrails on the edges. Have any leaves, snow, or ice cleared regularly. Use sand or salt on walking paths during winter. Clean up any spills in your garage right away. This includes oil or grease spills. What can I do in the bathroom? Use night lights. Install grab bars by the toilet and in the tub and shower. Do not use towel bars as grab bars. Use non-skid mats or decals in the tub or shower. If you need to sit down in the shower, use a plastic, non-slip stool. Keep the floor dry. Clean up any water that spills on the floor as soon as it happens. Remove soap buildup in the tub or shower regularly. Attach bath mats securely with double-sided  non-slip rug tape. Do not have throw rugs and other things on the floor that can make you trip. What can I do in the bedroom? Use night lights. Make sure that you have a light by your bed that is easy to reach. Do not use any sheets or blankets that are too big for your bed. They should not hang down onto the floor. Have a firm chair that has side arms. You can use this for support while you get dressed. Do not have throw rugs and other things on the floor that can make you trip. What can I do in the kitchen? Clean up any spills right away. Avoid walking on wet floors. Keep items that you use a lot in easy-to-reach places. If you need to reach something above you, use a strong step stool that has a grab bar. Keep electrical cords out of the way. Do not use floor polish or wax that makes floors slippery. If you must use wax, use non-skid floor wax. Do not have throw rugs and other things on the floor that can make you trip. What can I do with my stairs? Do not leave any items on the stairs. Make sure that there are handrails on both sides of the stairs and use them. Fix handrails that are broken or loose. Make sure that handrails are as long as the stairways. Check any carpeting to make sure that it is firmly attached to the stairs. Fix any carpet that is loose or  worn. Avoid having throw rugs at the top or bottom of the stairs. If you do have throw rugs, attach them to the floor with carpet tape. Make sure that you have a light switch at the top of the stairs and the bottom of the stairs. If you do not have them, ask someone to add them for you. What else can I do to help prevent falls? Wear shoes that: Do not have high heels. Have rubber bottoms. Are comfortable and fit you well. Are closed at the toe. Do not wear sandals. If you use a stepladder: Make sure that it is fully opened. Do not climb a closed stepladder. Make sure that both sides of the stepladder are locked into place. Ask  someone to hold it for you, if possible. Clearly mark and make sure that you can see: Any grab bars or handrails. First and last steps. Where the edge of each step is. Use tools that help you move around (mobility aids) if they are needed. These include: Canes. Walkers. Scooters. Crutches. Turn on the lights when you go into a dark area. Replace any light bulbs as soon as they burn out. Set up your furniture so you have a clear path. Avoid moving your furniture around. If any of your floors are uneven, fix them. If there are any pets around you, be aware of where they are. Review your medicines with your doctor. Some medicines can make you feel dizzy. This can increase your chance of falling. Ask your doctor what other things that you can do to help prevent falls. This information is not intended to replace advice given to you by your health care provider. Make sure you discuss any questions you have with your health care provider. Document Released: 09/08/2009 Document Revised: 04/19/2016 Document Reviewed: 12/17/2014 Elsevier Interactive Patient Education  2017 [employer].

## 2022-11-14 ENCOUNTER — Encounter: Payer: Self-pay | Admitting: Medical

## 2022-11-14 ENCOUNTER — Ambulatory Visit (INDEPENDENT_AMBULATORY_CARE_PROVIDER_SITE_OTHER): Payer: Medicare PPO | Admitting: Medical

## 2022-11-14 VITALS — BP 126/80 | HR 68 | Temp 98.3°F | Ht 62.5 in | Wt 120.0 lb

## 2022-11-14 DIAGNOSIS — M8580 Other specified disorders of bone density and structure, unspecified site: Secondary | ICD-10-CM

## 2022-11-14 DIAGNOSIS — E559 Vitamin D deficiency, unspecified: Secondary | ICD-10-CM

## 2022-11-14 DIAGNOSIS — Z7185 Encounter for immunization safety counseling: Secondary | ICD-10-CM | POA: Diagnosis not present

## 2022-11-14 DIAGNOSIS — Z780 Asymptomatic menopausal state: Secondary | ICD-10-CM | POA: Diagnosis not present

## 2022-11-14 DIAGNOSIS — Z0000 Encounter for general adult medical examination without abnormal findings: Secondary | ICD-10-CM

## 2022-11-14 DIAGNOSIS — J988 Other specified respiratory disorders: Secondary | ICD-10-CM

## 2022-11-14 DIAGNOSIS — R0989 Other specified symptoms and signs involving the circulatory and respiratory systems: Secondary | ICD-10-CM

## 2022-11-14 DIAGNOSIS — M19041 Primary osteoarthritis, right hand: Secondary | ICD-10-CM

## 2022-11-14 DIAGNOSIS — M19042 Primary osteoarthritis, left hand: Secondary | ICD-10-CM

## 2022-11-14 DIAGNOSIS — F419 Anxiety disorder, unspecified: Secondary | ICD-10-CM

## 2022-11-14 DIAGNOSIS — E2839 Other primary ovarian failure: Secondary | ICD-10-CM

## 2022-11-14 DIAGNOSIS — E785 Hyperlipidemia, unspecified: Secondary | ICD-10-CM

## 2022-11-14 DIAGNOSIS — I7300 Raynaud's syndrome without gangrene: Secondary | ICD-10-CM

## 2022-11-14 LAB — POCT URINALYSIS DIP (PROADVANTAGE DEVICE)
Bilirubin, UA: NEGATIVE
Glucose, UA: NEGATIVE mg/dL
Ketones, POC UA: NEGATIVE mg/dL
Leukocytes, UA: NEGATIVE
Nitrite, UA: NEGATIVE
Protein Ur, POC: NEGATIVE mg/dL
Specific Gravity, Urine: 1.005
Urobilinogen, Ur: 0.2
pH, UA: 7 (ref 5.0–8.0)

## 2022-11-14 NOTE — Progress Notes (Signed)
Subjective:   HPI  Charlotte Williams is a 76 y.o. female who presents for Chief Complaint  Patient presents with   Annual Exam    CPE still fasting, no other issues    Patient Care Team: Aidan Caloca, Leward Quan as PCP - General (Family Medicine) Sees dentist Sees eye doctor Dr. Zenovia Jarred, [facility]   Concerns: She complains of chronic regular throat clearing and mucous.   Uses mucinex daily for now.    Lives alone, 2 cats  Past Medical History:  Diagnosis Date   Adenomatous colon polyp    Allergy    Arthritis    Diverticulosis    GERD (gastroesophageal reflux disease)    Hypertension    Internal hemorrhoids    Osteopenia     Family History  Problem Relation Age of Onset   Endometriosis Daughter    Dementia Mother    Endometriosis Mother    Heart disease Father    Stroke Father    Arthritis Paternal Aunt    Rheum arthritis Paternal Aunt    Colon cancer Neg Hx    Esophageal cancer Neg Hx    Rectal cancer Neg Hx    Stomach cancer Neg Hx      Current Outpatient Medications:    amLODipine (NORVASC) 5 MG tablet, Take 1 tablet (5 mg total) by mouth daily., Disp: 90 tablet, Rfl: 0   azelastine (ASTELIN) 0.1 % nasal spray, PLACE 1 SPRAY IN EACH NOSTRIL TWICE A DAY, Disp: , Rfl:    latanoprost (XALATAN) 0.005 % ophthalmic solution, 1 drop at bedtime., Disp: , Rfl:    pantoprazole (PROTONIX) 40 MG tablet, TAKE 1 TABLET BY MOUTH EVERY DAY, Disp: 90 tablet, Rfl: 0   benzonatate (TESSALON) 200 MG capsule, Take 1 capsule (200 mg total) by mouth 3 (three) times daily as needed for cough. (Patient not taking: Reported on 11/14/2022), Disp: 30 capsule, Rfl: 0   clarithromycin (BIAXIN) 500 MG tablet, Take 1 tablet (500 mg total) by mouth 2 (two) times daily. (Patient not taking: Reported on 10/05/2022), Disp: 20 tablet, Rfl: 0   diclofenac sodium (VOLTAREN) 1 % GEL, Apply 2 g topically 4 (four) times daily. Rub into affected area of foot 2 to 4 times daily  (Patient not taking: Reported on 09/21/2022), Disp: 100 g, Rfl: 2   LORazepam (ATIVAN) 0.5 MG tablet, TAKE ONE TABLET BY MOUTH AT BEDTIME AS NEEDED FOR ANXIETY OR SLEEP (Patient not taking: Reported on 11/14/2022), Disp: 30 tablet, Rfl: 0   meloxicam (MOBIC) 7.5 MG tablet, Take 7.5 mg by mouth daily. (Patient not taking: Reported on 11/14/2022), Disp: , Rfl:   Allergies  Allergen Reactions   Codeine Itching   Sulfa Antibiotics Itching   Reviewed their medical, surgical, family, social, medication, and allergy history and updated chart as appropriate.   Review of Systems Constitutional: -fever, -chills, -sweats, -unexpected weight change, -decreased appetite, -fatigue Allergy: -sneezing, -itching, -congestion Dermatology: -changing moles, --rash, -lumps ENT: -runny nose, -ear pain, -sore throat, -hoarseness, -sinus pain, -teeth pain, - ringing in ears, -hearing loss, -nosebleeds Cardiology: -chest pain, -palpitations, -swelling, -difficulty breathing when lying flat, -waking up short of breath Respiratory: -cough, -shortness of breath, -difficulty breathing with exercise or exertion, -wheezing, -coughing up blood Gastroenterology: -abdominal pain, -nausea, -vomiting, -diarrhea, -constipation, -blood in stool, -changes in bowel movement, -difficulty swallowing or eating Hematology: -bleeding, -bruising  Musculoskeletal: -joint aches, -muscle aches, -joint swelling, -back pain, -neck pain, -cramping, -changes in gait Ophthalmology: denies vision changes, eye redness,  itching, discharge Urology: -burning with urination, -difficulty urinating, -blood in urine, -urinary frequency, -urgency, -incontinence Neurology: -headache, -weakness, -tingling, -numbness, -memory loss, -falls, -dizziness Psychology: -depressed mood, -agitation, -sleep problems Breast/gyn: -breast tendnerss, -discharge, -lumps, -vaginal discharge,- irregular periods, -heavy periods      10/05/2022    2:33 PM 08/16/2022     3:52 PM 11/20/2017   10:15 AM 10/22/2017    8:52 AM 08/20/2017    9:18 AM  Depression screen PHQ 2/9  Decreased Interest 0 0 0 0 0  Down, Depressed, Hopeless 0 0 0 0 0  PHQ - 2 Score 0 0 0 0 0  Altered sleeping 0      Tired, decreased energy 0      Change in appetite 0      Feeling bad or failure about yourself  0      Trouble concentrating 0      Moving slowly or fidgety/restless 0      Suicidal thoughts 0      PHQ-9 Score 0      Difficult doing work/chores Not difficult at all           Objective:  BP 126/80   Pulse 68   Temp 98.3 F (36.8 C)   Ht 5' 2.5" (1.588 m)   Wt 120 lb (54.4 kg)   BMI 21.60 kg/m   General appearance: alert, no distress, WD/WN, Caucasian female Skin: unremarkable HEENT: normocephalic, conjunctiva/corneas normal, sclerae anicteric, PERRLA, EOMi, nares patent, no discharge or erythema, pharynx normal Oral cavity: MMM, tongue normal, teeth normal Neck: supple, no lymphadenopathy, no thyromegaly, no masses, normal ROM, no bruits Chest: non tender, normal shape and expansion Heart: RRR, normal S1, S2, no murmurs Lungs: CTA bilaterally, no wheezes, rhonchi, or rales Abdomen: +bs, soft, non tender, non distended, no masses, no hepatomegaly, no splenomegaly, no bruits Back: non tender, normal ROM, no scoliosis Musculoskeletal: upper extremities non tender, no obvious deformity, normal ROM throughout, lower extremities non tender, no obvious deformity, normal ROM throughout Extremities: no edema, no cyanosis, no clubbing Pulses: 2+ symmetric, upper and lower extremities, normal cap refill Neurological: alert, oriented x 3, CN2-12 intact, strength normal upper extremities and lower extremities, sensation normal throughout, DTRs 2+ throughout, no cerebellar signs, gait normal Psychiatric: normal affect, behavior normal, pleasant  Breast/gyn/rectal - deferred to gynecology     Assessment and Plan :   Encounter Diagnoses  Name Primary?   Encounter for  health maintenance examination in adult Yes   Osteopenia, unspecified location    Post-menopausal    Estrogen deficiency    Anxiety    Hyperlipidemia, unspecified hyperlipidemia type    Vitamin D insufficiency    Raynaud's phenomenon without gangrene    Primary osteoarthritis of both hands    Throat clearing    Congestion of upper airway    Vaccine counseling      This visit was a preventative care visit, also known as wellness visit or routine physical.   Topics typically include healthy lifestyle, diet, exercise, preventative care, vaccinations, sick and well care, proper use of emergency dept and after hours care, as well as other concerns.     Recommendations: Continue to return yearly for your annual wellness and preventative care visits.  This gives [place] a chance to discuss healthy lifestyle, exercise, vaccinations, review your chart record, and perform screenings where appropriate.  I recommend you see your eye doctor yearly for routine vision care.  I recommend you see your dentist yearly for routine dental  care including hygiene visits twice yearly.   Vaccination recommendations were reviewed Immunization History  Administered Date(s) Administered   COVID-19, mRNA, vaccine(Comirnaty)12 years and older 11/14/2022   Fluad Quad(high Dose 65+) 08/16/2022   Influenza,inj,Quad PF,6+ Mos 08/20/2017   PFIZER Comirnaty(Gray Top)Covid-19 Tri-Sucrose Vaccine 07/10/2021   PFIZER(Purple Top)SARS-COV-2 Vaccination 10/11/2020   PNEUMOCOCCAL CONJUGATE-20 07/10/2021, 09/11/2021   Pfizer Covid-19 Vaccine Bivalent Booster 90yr & up 09/11/2021   Get you Tetanus Tdap booster and Shingrix at your pharmacy  Counseled on the Covid virus vaccine.  Vaccine information sheet given.  Covid vaccine given after consent obtained.    Screening for cancer: Colon cancer screening: I reviewed your colonoscopy on file that is up to date from 2023  Breast cancer screening: Mammograms are typically  not done after age 75 but if you want to continue screenings you may  Skin cancer screening: Check your skin regularly for new changes, growing lesions, or other lesions of concern Come in for evaluation if you have skin lesions of concern.  Lung cancer screening: If you have a greater than 20 pack year history of tobacco use, then you may qualify for lung cancer screening with a chest CT scan.   Please call your insurance company to inquire about coverage for this test.  We currently don't have screenings for other cancers besides breast, cervical, colon, and lung cancers.  If you have a strong family history of cancer or have other cancer screening concerns, please let me know.    Bone health: Get at least 150 minutes of aerobic exercise weekly Get weight bearing exercise at least once weekly Bone density test:  A bone density test is an imaging test that uses a type of X-ray to measure the amount of calcium and other minerals in your bones. The test may be used to diagnose or screen you for a condition that causes weak or thin bones (osteoporosis), predict your risk for a broken bone (fracture), or determine how well your osteoporosis treatment is working. The bone density test is recommended for females 626and older, or females or males <<25if certain risk factors such as thyroid disease, long term use of steroids such as for asthma or rheumatological issues, vitamin D deficiency, estrogen deficiency, family history of osteoporosis, self or family history of fragility fracture in first degree relative.  Please call to schedule your bone density test.   [facility] [phone] [address] [place] [id number]   Heart health: Get at least 150 minutes of aerobic exercise weekly Limit alcohol It is important to maintain a healthy blood pressure and healthy cholesterol numbers  Heart disease screening: Screening for heart disease  includes screening for blood pressure, fasting lipids, glucose/diabetes screening, BMI height to weight ratio, reviewed of smoking status, physical activity, and diet.    Goals include blood pressure 120/80 or less, maintaining a healthy lipid/cholesterol profile, preventing diabetes or keeping diabetes numbers under good control, not smoking or using tobacco products, exercising most days per week or at least 150 minutes per week of exercise, and eating healthy variety of fruits and vegetables, healthy oils, and avoiding unhealthy food choices like fried food, fast food, high sugar and high cholesterol foods.    Other tests may possibly include EKG test, CT coronary calcium score, echocardiogram, exercise treadmill stress test.     Medical care options: I recommend you continue to seek care here first for routine care.  We try really  hard to have available appointments Monday through Friday daytime hours for sick visits, acute visits, and physicals.  Urgent care should be used for after hours and weekends for significant issues that cannot wait till the next day.  The emergency department should be used for significant potentially life-threatening emergencies.  The emergency department is expensive, can often have long wait times for less significant concerns, so try to utilize primary care, urgent care, or telemedicine when possible to avoid unnecessary trips to the emergency department.  Virtual visits and telemedicine have been introduced since the pandemic started in 2020, and can be convenient ways to receive medical care.  We offer virtual appointments as well to assist you in a variety of options to seek medical care.   Advanced Directives: I recommend you consider completing a [place] and Living Will.   These documents respect your wishes and help alleviate burdens on your loved ones if you were to become terminally ill or be in a position to need those documents enforced.     You can complete Advanced Directives yourself, have them notarized, then have copies made for our office, for you and for anybody you feel should have them in safe keeping.  Or, you can have an attorney prepare these documents.   If you haven't updated your Last Will and Testament in a while, it may be worthwhile having an attorney prepare these documents together and save on some costs.       Separate significant issues discussed: Osteopenia -get weight bearing and aerobic exercise regularly.   Go ahead and schedule bone density test soon  Hyperlipidemia - updated labs today, eat a low cholesterol diet  Vitamin D deficiency - continue supplement, updated lab today  Congestion of airway - continue neti pot, consider allergy pill such as Claritin daily in morning or xyzal at night, consider ENT consult  Osteoarthritis- you can use oral Tylenol as needed, topical Aspercreme as needed     Gladyce was seen today for annual exam.  Diagnoses and all orders for this visit:  Encounter for health maintenance examination in adult -     Lipid panel -     VITAMIN D 25 Hydroxy (Vit-D Deficiency, Fractures) -     POCT Urinalysis DIP (Proadvantage Device) -     DG Bone Density; Future -     TSH  Osteopenia, unspecified location -     VITAMIN D 25 Hydroxy (Vit-D Deficiency, Fractures) -     TSH  Post-menopausal  Estrogen deficiency  Anxiety  Hyperlipidemia, unspecified hyperlipidemia type -     Lipid panel  Vitamin D insufficiency -     VITAMIN D 25 Hydroxy (Vit-D Deficiency, Fractures)  Raynaud's phenomenon without gangrene  Primary osteoarthritis of both hands  Throat clearing  Congestion of upper airway -     Spirometry with graph  Vaccine counseling -     Pfizer Fall 2023 Covid-19 Vaccine 35yr and older    Follow-up pending labs, yearly for physical

## 2022-11-14 NOTE — Patient Instructions (Addendum)
This visit was a preventative care visit, also known as wellness visit or routine physical.   Topics typically include healthy lifestyle, diet, exercise, preventative care, vaccinations, sick and well care, proper use of emergency dept and after hours care, as well as other concerns.     Recommendations: Continue to return yearly for your annual wellness and preventative care visits.  This gives [place] a chance to discuss healthy lifestyle, exercise, vaccinations, review your chart record, and perform screenings where appropriate.  I recommend you see your eye doctor yearly for routine vision care.  I recommend you see your dentist yearly for routine dental care including hygiene visits twice yearly.   Vaccination recommendations were reviewed Immunization History  Administered Date(s) Administered   Fluad Quad(high Dose 65+) 08/16/2022   Influenza,inj,Quad PF,6+ Mos 08/20/2017   PFIZER Comirnaty(Gray Top)Covid-19 Tri-Sucrose Vaccine 07/10/2021   PFIZER(Purple Top)SARS-COV-2 Vaccination 10/11/2020   PNEUMOCOCCAL CONJUGATE-20 07/10/2021, 09/11/2021   Pfizer Covid-19 Vaccine Bivalent Booster 58yr & up 09/11/2021   Get you Tetanus Tdap booster and Shingrix at your pharmacy  Counseled on the Covid virus vaccine.  Vaccine information sheet given.  Covid vaccine given after consent obtained.    Screening for cancer: Colon cancer screening: I reviewed your colonoscopy on file that is up to date from 2023  Breast cancer screening: Mammograms are typically not done after age 75 but if you want to continue screenings you may  Skin cancer screening: Check your skin regularly for new changes, growing lesions, or other lesions of concern Come in for evaluation if you have skin lesions of concern.  Lung cancer screening: If you have a greater than 20 pack year history of tobacco use, then you may qualify for lung cancer screening with a chest CT scan.   Please call your insurance company to  inquire about coverage for this test.  We currently don't have screenings for other cancers besides breast, cervical, colon, and lung cancers.  If you have a strong family history of cancer or have other cancer screening concerns, please let me know.    Bone health: Get at least 150 minutes of aerobic exercise weekly Get weight bearing exercise at least once weekly Bone density test:  A bone density test is an imaging test that uses a type of X-ray to measure the amount of calcium and other minerals in your bones. The test may be used to diagnose or screen you for a condition that causes weak or thin bones (osteoporosis), predict your risk for a broken bone (fracture), or determine how well your osteoporosis treatment is working. The bone density test is recommended for females 619and older, or females or males <<34if certain risk factors such as thyroid disease, long term use of steroids such as for asthma or rheumatological issues, vitamin D deficiency, estrogen deficiency, family history of osteoporosis, self or family history of fragility fracture in first degree relative.  Please call to schedule your bone density test.   [facility] [phone] [address] [place] [address]   Heart health: Get at least 150 minutes of aerobic exercise weekly Limit alcohol It is important to maintain a healthy blood pressure and healthy cholesterol numbers  Heart disease screening: Screening for heart disease includes screening for blood pressure, fasting lipids, glucose/diabetes screening, BMI height to weight ratio, reviewed of smoking status, physical activity, and diet.    Goals include blood pressure 120/80 or less, maintaining a healthy lipid/cholesterol profile,  preventing diabetes or keeping diabetes numbers under good control, not smoking or using tobacco products, exercising most days per week or at least 150 minutes per week of  exercise, and eating healthy variety of fruits and vegetables, healthy oils, and avoiding unhealthy food choices like fried food, fast food, high sugar and high cholesterol foods.    Other tests may possibly include EKG test, CT coronary calcium score, echocardiogram, exercise treadmill stress test.     Medical care options: I recommend you continue to seek care here first for routine care.  We try really hard to have available appointments Monday through Friday daytime hours for sick visits, acute visits, and physicals.  Urgent care should be used for after hours and weekends for significant issues that cannot wait till the next day.  The emergency department should be used for significant potentially life-threatening emergencies.  The emergency department is expensive, can often have long wait times for less significant concerns, so try to utilize primary care, urgent care, or telemedicine when possible to avoid unnecessary trips to the emergency department.  Virtual visits and telemedicine have been introduced since the pandemic started in 2020, and can be convenient ways to receive medical care.  We offer virtual appointments as well to assist you in a variety of options to seek medical care.   Advanced Directives: I recommend you consider completing a [place] and Living Will.   These documents respect your wishes and help alleviate burdens on your loved ones if you were to become terminally ill or be in a position to need those documents enforced.    You can complete Advanced Directives yourself, have them notarized, then have copies made for our office, for you and for anybody you feel should have them in safe keeping.  Or, you can have an attorney prepare these documents.   If you haven't updated your Last Will and Testament in a while, it may be worthwhile having an attorney prepare these documents together and save on some costs.       Separate significant issues  discussed: Osteopenia -get weight bearing and aerobic exercise regularly.   Go ahead and schedule bone density test soon  Hyperlipidemia - updated labs today, eat a low cholesterol diet  Vitamin D deficiency - continue supplement, updated lab today  Congestion of airway -  continue neti pot, consider allergy pill such as Claritin daily in morning or different pill at night such as Allegra or Xyzal.  Consider ENT consult  Osteoarthritis- you can use oral Tylenol as needed, topical Aspercreme as needed

## 2022-11-15 ENCOUNTER — Other Ambulatory Visit: Payer: Self-pay | Admitting: Medical

## 2022-11-15 DIAGNOSIS — Z1231 Encounter for screening mammogram for malignant neoplasm of breast: Secondary | ICD-10-CM

## 2022-11-15 DIAGNOSIS — M8580 Other specified disorders of bone density and structure, unspecified site: Secondary | ICD-10-CM

## 2022-11-15 DIAGNOSIS — Z0000 Encounter for general adult medical examination without abnormal findings: Secondary | ICD-10-CM

## 2022-11-15 DIAGNOSIS — Z780 Asymptomatic menopausal state: Secondary | ICD-10-CM

## 2022-11-15 LAB — TSH: TSH: 1.69 u[IU]/mL (ref 0.450–4.500)

## 2022-11-15 LAB — LIPID PANEL
Chol/HDL Ratio: 2.8 ratio (ref 0.0–4.4)
Cholesterol, Total: 228 mg/dL — ABNORMAL HIGH (ref 100–199)
HDL: 82 mg/dL (ref >39)
LDL Chol Calc (NIH): 125 mg/dL — ABNORMAL HIGH (ref 0–99)
Triglycerides: 124 mg/dL (ref 0–149)
VLDL Cholesterol Cal: 21 mg/dL (ref 5–40)

## 2022-11-15 LAB — VITAMIN D 25 HYDROXY (VIT D DEFICIENCY, FRACTURES): Vit D, 25-Hydroxy: 60.4 ng/mL (ref 30.0–100.0)

## 2022-11-15 NOTE — Progress Notes (Signed)
Results sent through MyChart

## 2022-12-28 ENCOUNTER — Encounter: Payer: Self-pay | Admitting: Nurse Practitioner

## 2022-12-28 ENCOUNTER — Ambulatory Visit: Payer: Medicare PPO | Admitting: Nurse Practitioner

## 2022-12-28 VITALS — BP 124/82 | HR 90 | Ht 62.5 in | Wt 118.5 lb

## 2022-12-28 DIAGNOSIS — R142 Eructation: Secondary | ICD-10-CM | POA: Diagnosis not present

## 2022-12-28 DIAGNOSIS — R143 Flatulence: Secondary | ICD-10-CM

## 2022-12-28 DIAGNOSIS — R1011 Right upper quadrant pain: Secondary | ICD-10-CM | POA: Diagnosis not present

## 2022-12-28 NOTE — Progress Notes (Signed)
Addendum: Reviewed and agree with assessment and management plan. Daliana Leverett M, MD  

## 2022-12-28 NOTE — Patient Instructions (Signed)
You have been scheduled for a HIDA scan at [facility] Radiology (1st floor) on 01/14/23 Monday. Please arrive 30 minutes prior to your scheduled appointment at  5:57 am. Make certain not to have anything to eat or drink at least 8hours prior to your test. Should this appointment date or time not work well for you, please call radiology scheduling at [phone].  _____________________________________________________________________ hepatobiliary (HIDA) scan is an imaging procedure used to diagnose problems in the liver, gallbladder and bile ducts. In the HIDA scan, a radioactive chemical or tracer is injected into a vein in your arm. The tracer is handled by the liver like bile. Bile is a fluid produced and excreted by your liver that helps your digestive system break down fats in the foods you eat. Bile is stored in your gallbladder and the gallbladder releases the bile when you eat a meal. A special nuclear medicine scanner (gamma camera) tracks the flow of the tracer from your liver into your gallbladder and small intestine.  During your HIDA scan  You'll be asked to change into a hospital gown before your HIDA scan begins. Your health care team will position you on a table, usually on your back. The radioactive tracer is then injected into a vein in your arm.The tracer travels through your bloodstream to your liver, where it's taken up by the bile-producing cells. The radioactive tracer travels with the bile from your liver into your gallbladder and through your bile ducts to your small intestine.You may feel some pressure while the radioactive tracer is injected into your vein. As you lie on the table, a special gamma camera is positioned over your abdomen taking pictures of the tracer as it moves through your body. The gamma camera takes pictures continually for about an hour. You'll need to keep still during the HIDA scan. This can become uncomfortable, but you may find that you can lessen the discomfort  by taking deep breaths and thinking about other things. Tell your health care team if you're uncomfortable. The radiologist will watch on a computer the progress of the radioactive tracer through your body. The HIDA scan may be stopped when the radioactive tracer is seen in the gallbladder and enters your small intestine. This typically takes about an hour. In some cases extra imaging will be performed if original images aren't satisfactory, if morphine is given to help visualize the gallbladder or if the medication CCK is given to look at the contraction of the gallbladder. This test typically takes 2 hours to complete. ______________________________________________________________  We have sent the following medications to your pharmacy for you to pick up at your convenience: Gas-X _______________________________________________________  If your blood pressure at your visit was 140/90 or greater, please contact your primary care physician to follow up on this.  _______________________________________________________  If you are age 47 or older, your body mass index should be between 23-30. Your Body mass index is 21.33 kg/m. If this is out of the aforementioned range listed, please consider follow up with your Primary Care Provider.  If you are age 68 or younger, your body mass index should be between 19-25. Your Body mass index is 21.33 kg/m. If this is out of the aformentioned range listed, please consider follow up with your Primary Care Provider.   ________________________________________________________  The [place] [facility] providers would like to encourage you to use [facility] to communicate with providers for non-urgent requests or questions.  Due to long hold times on the telephone, sending your provider a message  by [facility] may be a faster and more efficient way to get a response.  Please allow 48 business hours for a response.  Please remember that this is for non-urgent requests.   _______________________________________________________  Due to recent changes in healthcare laws, you may see the results of your imaging and laboratory studies on MyChart before your provider has had a chance to review them.  We understand that in some cases there may be results that are confusing or concerning to you. Not all laboratory results come back in the same time frame and the provider may be waiting for multiple results in order to interpret others.  Please give [place] 48 hours in order for your provider to thoroughly review all the results before contacting the office for clarification of your results.

## 2022-12-28 NOTE — Progress Notes (Signed)
Assessment    Patient profile:  Charlotte Williams is a 77 y.o. female known to Dr. Hilarie Fredrickson with a past medical history of adenomatous colon polyps, esophageal candida, diverticulosis, osteopenia. See PMH /PSH for additional history  # 77 yo female with chronic, intermittent RUQ pain which  generally occurs after eating, especially fried foods. Prior CT scan , [place] and EGD were unremarkable.    Plan   HIDA scan Trial of gas-x. Consider SIBO testing  HPI    Chief complaint: recurrent right sided abdominal pain    Anhar has chronic, intermittent RUQ pain. Lab, [place], CT scan and EGD were unrevealing. She was seen last in the office in Oct 2023. A HIDA was ordered. She called a few days later and cancelled the HIDA because she was feeling better. She made several changes to her diet ( stopped soda, caffeine, Etoh, mint herbs and fried foods) and continued to do well until a couple of weeks ago when she had fried foods for lunch.  Following that she had another "attack" of severe stabbing RUQ pain. The pain did not radiate through to her back. It lasted about 3 hours and was so severe that she had to stay still. She had no associated N/V . Bowel movements are normal. Her only other complaint is that of increased belching and flatus. She is no longer taking pantoprazole. Says she was prescribed it sometime back for heartburn. She is asymptomatic on med  Weight is stable  Reviewed UA from 12/20. Had small amount of blood  Previous GI Evaluation   May 2023 EGD and colonoscopy  - Esophageal plaques were found, consistent with candidiasis. - 3 cm hiatal hernia. - Normal stomach. Biopsied. - Normal examined duodenum.   May 2023 colonoscopy  - One 6 mm polyp in the transverse colon, removed with a cold snare. Resected and retrieved. - Diverticulosis in the sigmoid colon and in the descending colon. - Internal hemorrhoids.   Surgical [P], gastric antrum and gastric body - GASTRIC OXYNTIC  MUCOSA WITH PARIETAL CELL HYPERPLASIA AS CAN BE SEEN IN HYPERGASTRINEMIC STATES SUCH AS PPI THERAPY. - GASTRIC ANTRAL MUCOSA WITH NO SPECIFIC HISTOPATHOLOGIC CHANGES - HELICOBACTER PYLORI-LIKE ORGANISMS ARE NOT IDENTIFIED ON ROUTINE H&E STAIN 2. Surgical [P], colon, transverse, polyp (1) - SESSILE SERRATED POLYP(S) WITHOUT CYTOLOGIC DYSPLASIA  Labs:     Latest Ref Rng & Units 08/09/2022    3:08 PM 08/17/2017    9:04 AM 05/11/2008   11:27 AM  CBC  WBC 4.0 - 10.5 K/uL 7.4  9.1    Hemoglobin 12.0 - 15.0 g/dL 12.6  15.2  13.2   Hematocrit 36.0 - 46.0 % 36.8  44.1    Platelets 150.0 - 400.0 K/uL 271.0          Latest Ref Rng & Units 08/09/2022    3:08 PM 02/13/2022    2:56 PM 08/17/2017    9:29 AM  Hepatic Function  Total Protein 6.0 - 8.3 g/dL 6.5  6.7  6.9   Albumin 3.5 - 5.2 g/dL 3.9  4.3  5.1   AST 0 - 37 U/L '21  22  28   '$ ALT 0 - 35 U/L '15  19  22   '$ Alk Phosphatase 39 - 117 U/L 55  54  46   Total Bilirubin 0.2 - 1.2 mg/dL 0.4  0.5  0.4      Past Medical History:  Diagnosis Date   Adenomatous colon polyp    Allergy  Arthritis    Diverticulosis    GERD (gastroesophageal reflux disease)    Hypertension    Internal hemorrhoids    Osteopenia     Past Surgical History:  Procedure Laterality Date   APPENDECTOMY     ROTATOR CUFF REPAIR Right    Bicept repair   TONSILLECTOMY AND ADENOIDECTOMY      Current Medications, Allergies, Family History and Social History were reviewed in [employer] record.     Current Outpatient Medications  Medication Sig Dispense Refill   amLODipine (NORVASC) 5 MG tablet Take 1 tablet (5 mg total) by mouth daily. 90 tablet 0   latanoprost (XALATAN) 0.005 % ophthalmic solution 1 drop at bedtime.     LORazepam (ATIVAN) 0.5 MG tablet TAKE ONE TABLET BY MOUTH AT BEDTIME AS NEEDED FOR ANXIETY OR SLEEP (Patient not taking: Reported on 11/14/2022) 30 tablet 0   No current facility-administered medications for this visit.     Review of Systems: No chest pain. No shortness of breath. No urinary complaints.    Physical Exam  Wt Readings from Last 3 Encounters:  11/14/22 120 lb (54.4 kg)  10/05/22 118 lb (53.5 kg)  09/21/22 118 lb (53.5 kg)    BP 124/82   Pulse 90   Ht 5' 2.5" (1.588 m)   Wt 118 lb 8 oz (53.8 kg)   SpO2 96%   BMI 21.33 kg/m  Constitutional:  Pleasant, generally well appearing female in no acute distress. Psychiatric: Normal mood and affect. Behavior is normal. EENT: Pupils normal.  Conjunctivae are normal. No scleral icterus. Neck supple.  Cardiovascular: Normal rate, regular rhythm.  Pulmonary/chest: Effort normal and breath sounds normal. No wheezing, rales or rhonchi. Abdominal: Soft, nondistended, nontender. Bowel sounds active throughout. There are no masses palpable. No hepatomegaly. Neurological: Alert and oriented to person place and time. Musculoskeletal:  Skin: Skin is warm and dry. No rashes noted.  Tye Savoy, NP  12/28/2022, 8:26 AM

## 2023-01-14 ENCOUNTER — Other Ambulatory Visit (HOSPITAL_COMMUNITY): Payer: Medicare PPO

## 2023-01-15 ENCOUNTER — Encounter (HOSPITAL_COMMUNITY)
Admission: RE | Admit: 2023-01-15 | Discharge: 2023-01-15 | Disposition: A | Discharge status: Home / Self Care | Payer: Medicare PPO | Source: Ambulatory Visit | Attending: Nurse Practitioner | Admitting: Nurse Practitioner

## 2023-01-15 DIAGNOSIS — R142 Eructation: Secondary | ICD-10-CM | POA: Diagnosis not present

## 2023-01-15 DIAGNOSIS — R1011 Right upper quadrant pain: Secondary | ICD-10-CM | POA: Insufficient documentation

## 2023-01-15 DIAGNOSIS — R143 Flatulence: Secondary | ICD-10-CM | POA: Diagnosis not present

## 2023-01-15 MED ORDER — TECHNETIUM TC 99M MEBROFENIN IV KIT: 5.0000 | PACK | Freq: Once | INTRAVENOUS | Status: DC | PRN

## 2023-01-25 DIAGNOSIS — H2513 Age-related nuclear cataract, bilateral: Secondary | ICD-10-CM | POA: Diagnosis not present

## 2023-01-25 DIAGNOSIS — H40023 Open angle with borderline findings, high risk, bilateral: Secondary | ICD-10-CM | POA: Diagnosis not present

## 2023-01-25 DIAGNOSIS — H0102A Squamous blepharitis right eye, upper and lower eyelids: Secondary | ICD-10-CM | POA: Diagnosis not present

## 2023-01-25 DIAGNOSIS — H43813 Vitreous degeneration, bilateral: Secondary | ICD-10-CM | POA: Diagnosis not present

## 2023-01-25 DIAGNOSIS — H02135 Senile ectropion of left lower eyelid: Secondary | ICD-10-CM | POA: Diagnosis not present

## 2023-01-25 DIAGNOSIS — H04123 Dry eye syndrome of bilateral lacrimal glands: Secondary | ICD-10-CM | POA: Diagnosis not present

## 2023-01-25 DIAGNOSIS — H10413 Chronic giant papillary conjunctivitis, bilateral: Secondary | ICD-10-CM | POA: Diagnosis not present

## 2023-01-25 DIAGNOSIS — H0102B Squamous blepharitis left eye, upper and lower eyelids: Secondary | ICD-10-CM | POA: Diagnosis not present

## 2023-01-25 DIAGNOSIS — H02132 Senile ectropion of right lower eyelid: Secondary | ICD-10-CM | POA: Diagnosis not present

## 2023-02-12 ENCOUNTER — Telehealth: Payer: Medicare PPO | Admitting: Medical

## 2023-02-12 ENCOUNTER — Encounter: Payer: Self-pay | Admitting: Medical

## 2023-02-12 ENCOUNTER — Telehealth: Payer: Self-pay | Admitting: Medical

## 2023-02-12 ENCOUNTER — Other Ambulatory Visit: Payer: Self-pay | Admitting: Medical

## 2023-02-12 VITALS — Temp 98.7°F | Ht 63.0 in | Wt 119.0 lb

## 2023-02-12 DIAGNOSIS — R051 Acute cough: Secondary | ICD-10-CM | POA: Diagnosis not present

## 2023-02-12 DIAGNOSIS — U071 COVID-19: Secondary | ICD-10-CM | POA: Diagnosis not present

## 2023-02-12 MED ORDER — NIRMATRELVIR/RITONAVIR (PAXLOVID)TABLET: 3.0000 | ORAL_TABLET | Freq: Two times a day (BID) | ORAL | 0 refills | Status: AC

## 2023-02-12 NOTE — Progress Notes (Signed)
Subjective:     Patient ID: Charlotte Williams, female   DOB: 04-Apr-1946, 77 y.o.   MRN: [id number]  This visit type was conducted due to national recommendations for restrictions regarding the COVID-19 Pandemic (e.g. social distancing) in an effort to limit this patient's exposure and mitigate transmission in our community.  Due to their co-morbid illnesses, this patient is at least at moderate risk for complications without adequate follow up.  This format is felt to be most appropriate for this patient at this time.    Documentation for virtual audio and video telecommunications through [place] encounter:  The patient was located at home. The provider was located in the office. The patient did consent to this visit and is aware of possible charges through their insurance for this visit.  The other persons participating in this telemedicine service were none. Time spent on call was 20 minutes and in review of previous records 20 minutes total.  This virtual service is not related to other E/M service within previous 7 days.   HPI Chief Complaint  Patient presents with   other    Positive for covid tested Sunday night, symptoms Sunday, cold symptoms, doesn't feel good, tired and fatigued, cough, no fever had body aches yesterday,    Virtual consult for illness.  Tested positive for covid 2 days ago.   Symptoms began Sunday with malaise, stopped up in head.   Having body aches.  No fever.  Having some cough.  No sore throat.  No SOB or wheezing.  No NVD.  Cough currently is mild.  Using tylenol, generic allergic medication.   Has had covid once prior, didn't feel real bad.  No other aggravating or relieving factors. No other complaint.   Past Medical History:  Diagnosis Date   Adenomatous colon polyp    Allergy    Arthritis    Diverticulosis    GERD (gastroesophageal reflux disease)    Hypertension    Internal hemorrhoids    Osteopenia    Current Outpatient Medications on File  Prior to Visit  Medication Sig Dispense Refill   amLODipine (NORVASC) 5 MG tablet Take 2.5 mg by mouth daily.     latanoprost (XALATAN) 0.005 % ophthalmic solution 1 drop at bedtime.     LORazepam (ATIVAN) 0.5 MG tablet TAKE ONE TABLET BY MOUTH AT BEDTIME AS NEEDED FOR ANXIETY OR SLEEP (Patient not taking: Reported on 02/12/2023) 30 tablet 0   pantoprazole (PROTONIX) 40 MG tablet Take 40 mg by mouth daily. PRN (Patient not taking: Reported on 02/12/2023)     No current facility-administered medications on file prior to visit.     Review of Systems As in subjective    Objective:   Physical Exam Due to coronavirus pandemic stay at home measures, patient visit was virtual and they were not examined in person.   Temp 98.7 F (37.1 C)   Ht 5\' 3"  (1.6 m)   Wt 119 lb (54 kg)   BMI 21.08 kg/m   Gen: wd,wn,nad ,mildly ill appearing No labored breathign or wheezing      Assessment:     Encounter Diagnoses  Name Primary?   COVID Yes   Acute cough        Plan:     Currently symtpoms are mild.  Discussed paxlovid, but she declines for now  Covid infection, covid illness general recommendations:  I recommend you rest, hydrate well with water and clear fluids throughout the day.  If you feel dry in  the mouth, tongue or feel that you are urinating as much as usual, then increase hydration.  You urine should be like yellow to clear, not dark yellow or darker.     Pain, body aches, or fever: You can use Tylenol /Acetaminophen 325mg  over the counter for pain or fever, every 4-6 hours   Cough and congestion: You can use over the counter Mucinex DM or Coricidin HBP for cough and congestion   Drainage and congestion: You can use over the counter antihistamine such as zyrtec, allegra, or benadryl as directed on the label    Nausea: You can use over the counter Emetrol for nausea.     Antiviral medication: consider medication Paxlovid to help reduce your risk of hospitalization  or severe illness.    In the next few days, if you are having trouble breathing, if you are very weak, have high fever 103 or higher consistently despite Tylenol, or uncontrollable nausea and vomiting, then call or go to the emergency department.    If you have other questions or have other symptoms or questions you are concerned about then please make a virtual visit   Covid symptoms such as fatigue and cough can linger over 2 weeks, even after the initial fever, aches, chills, and other initial symptoms.   We discussed self quarantine as well    Kendal was seen today for other.  Diagnoses and all orders for this visit:  COVID  Acute cough  F/u prn

## 2023-02-12 NOTE — Telephone Encounter (Signed)
Pt called and is requesting the rx paxlovid said she would going to try it She uses  [id number] - [place], [place] - [place]

## 2023-02-16 IMAGING — MG MM DIGITAL SCREENING BILAT W/ TOMO AND CAD
8 series · 9 of 24 positions shown · non-contrast
Comparison: Previous exam(s).

CLINICAL DATA: Screening.

EXAM:
DIGITAL SCREENING BILATERAL MAMMOGRAM WITH TOMOSYNTHESIS AND CAD
TECHNIQUE: Bilateral screening digital craniocaudal and mediolateral oblique
mammograms were obtained. Bilateral screening digital breast
tomosynthesis was performed. The images were evaluated with
computer-aided detection.

[L CC synth-2D]
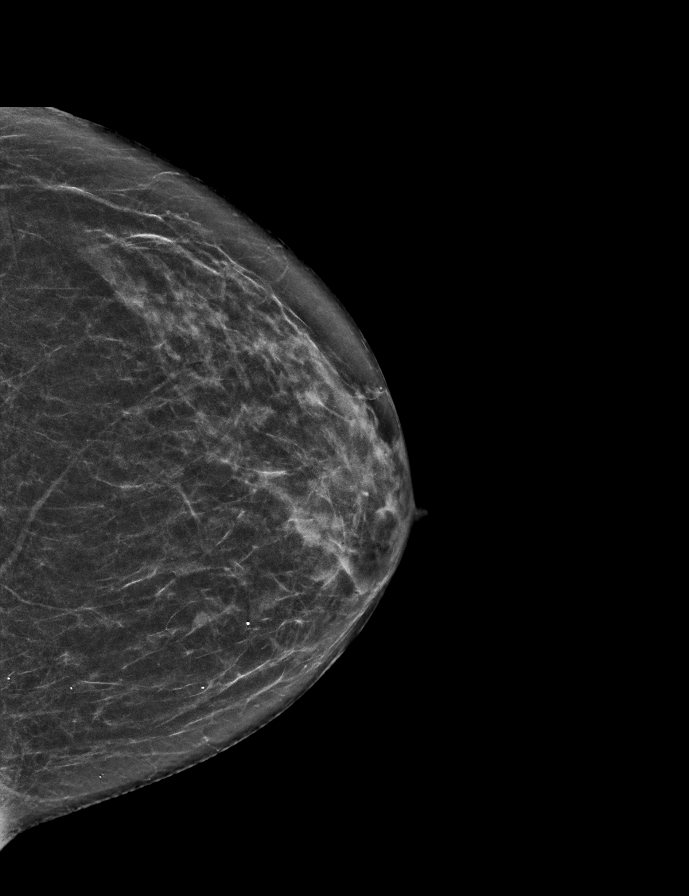

[R MLO synth-2D]
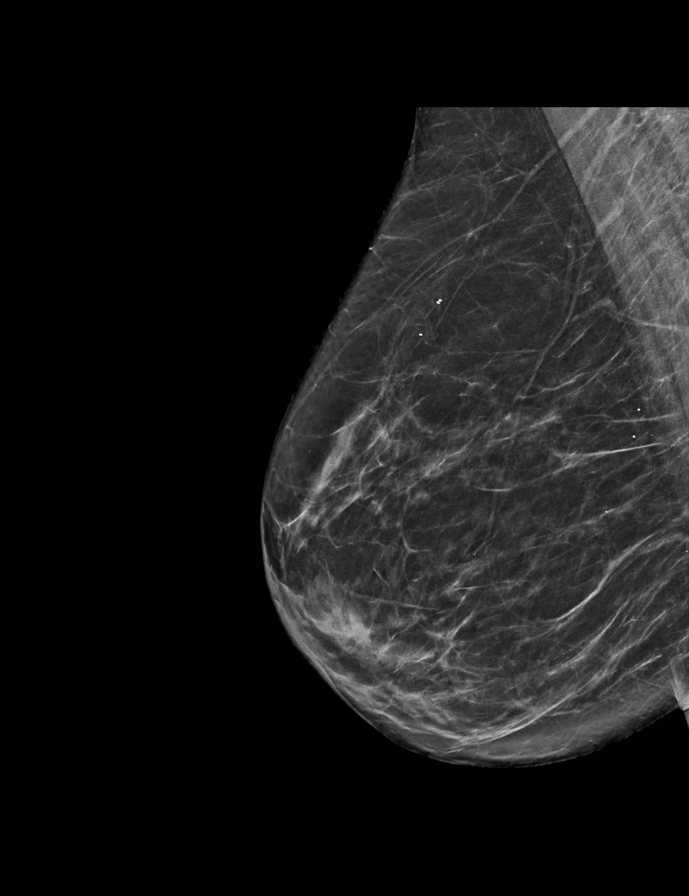

[L MLO synth-2D]
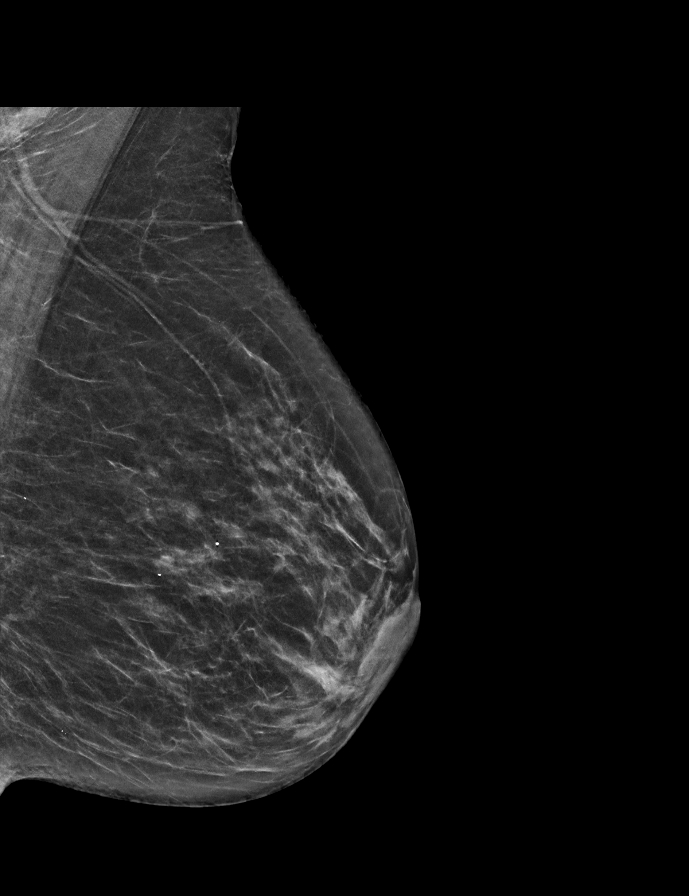

[R CC synth-2D]
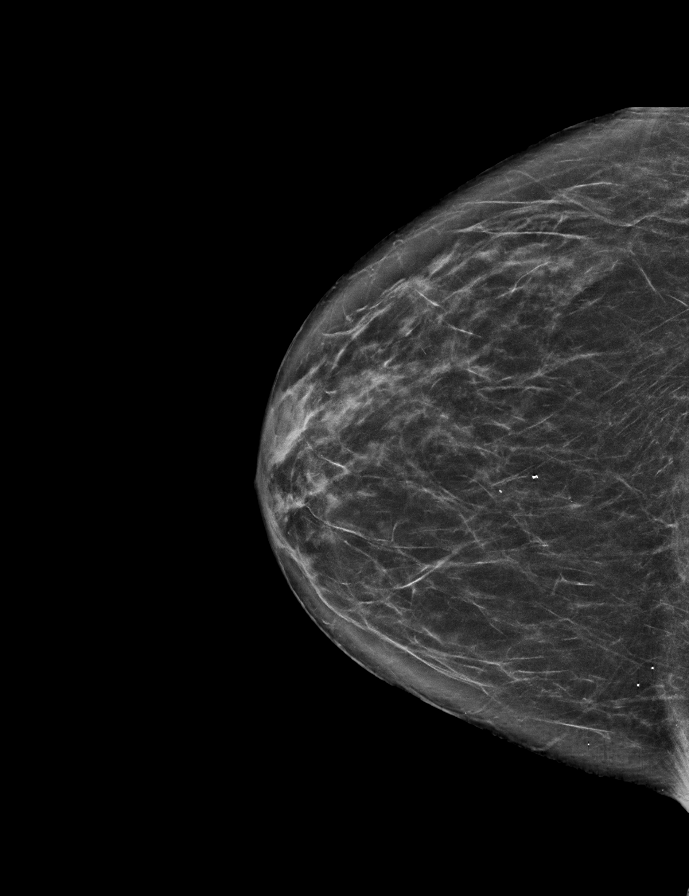

[L CC tomo · 2 of 54 frames shown]
[frame 18/54]
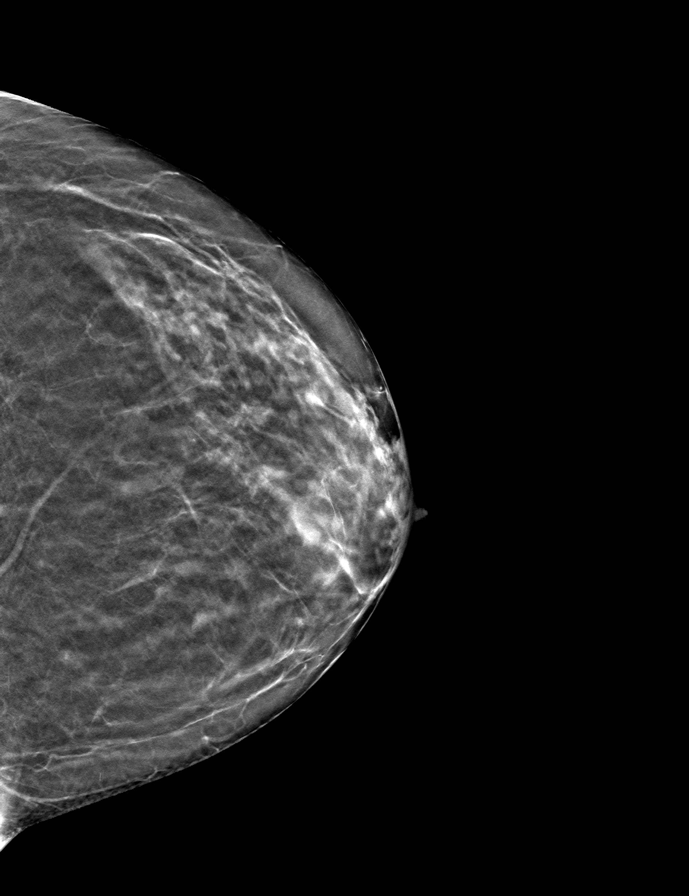
[frame 27/54]
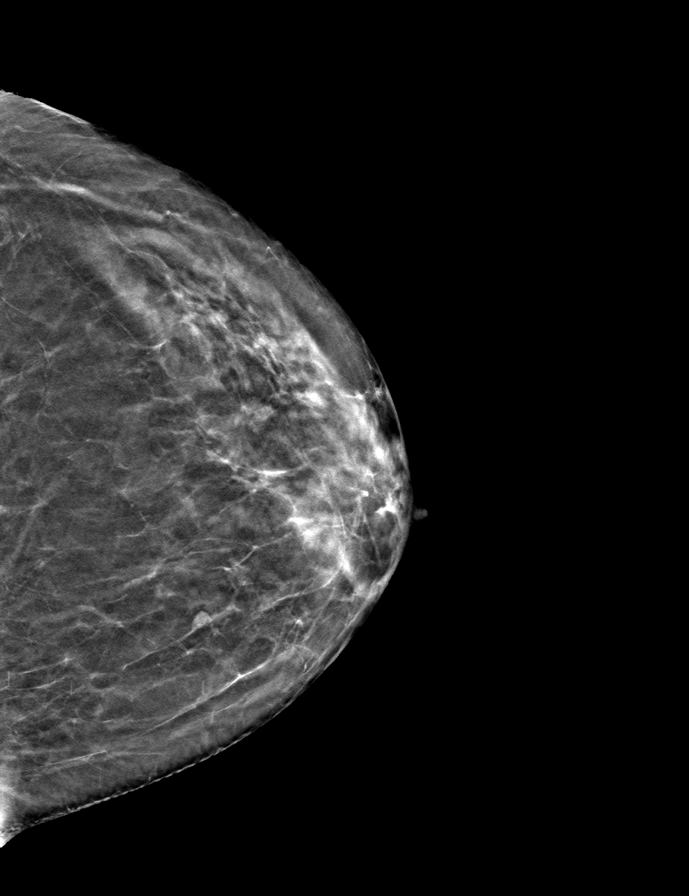

[R MLO tomo · tomo slice 33/64.0]
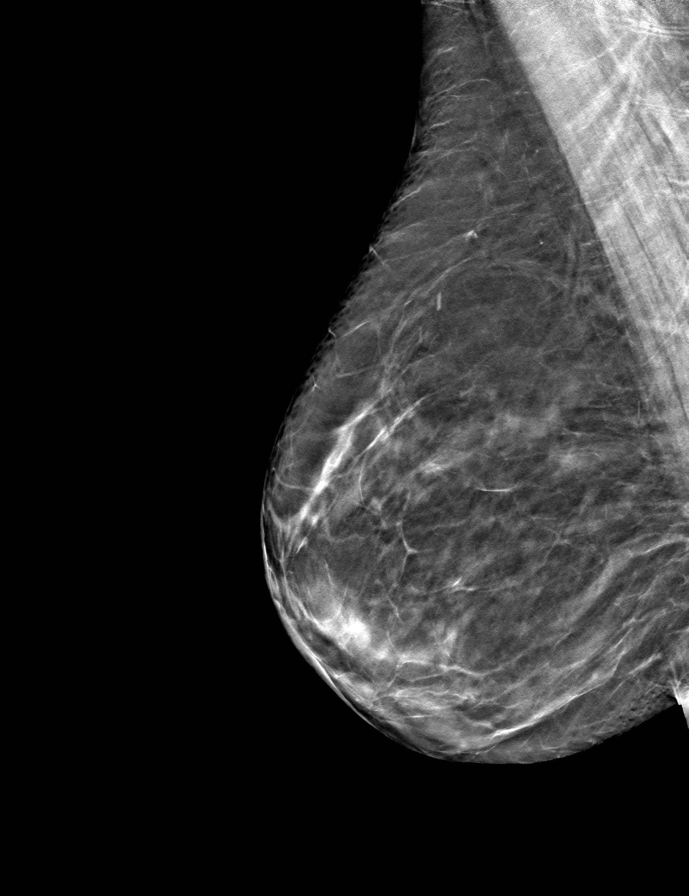

[L MLO tomo · tomo slice 29/58.0]
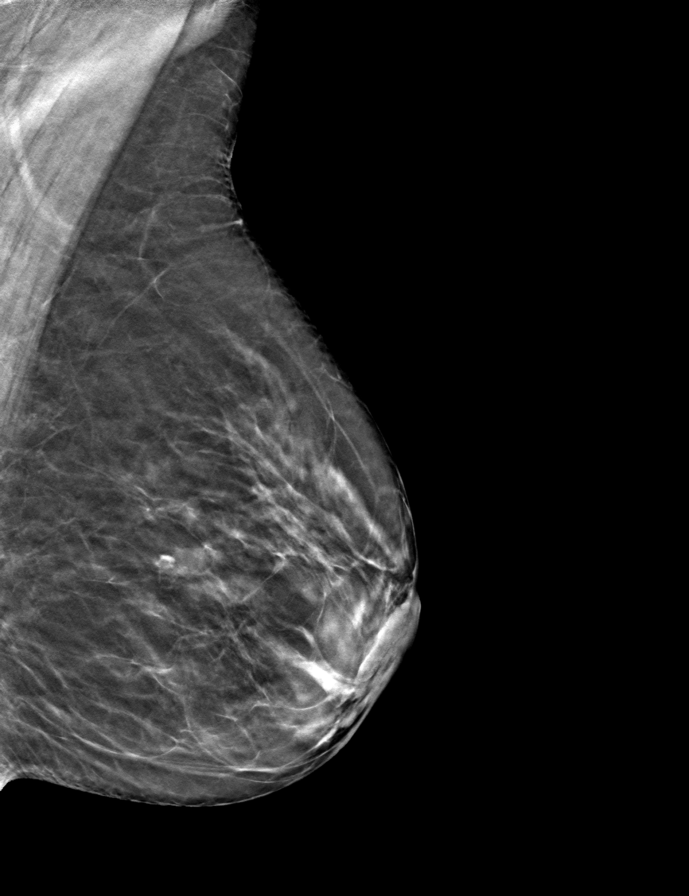

[R CC tomo · tomo slice 31/62.0]
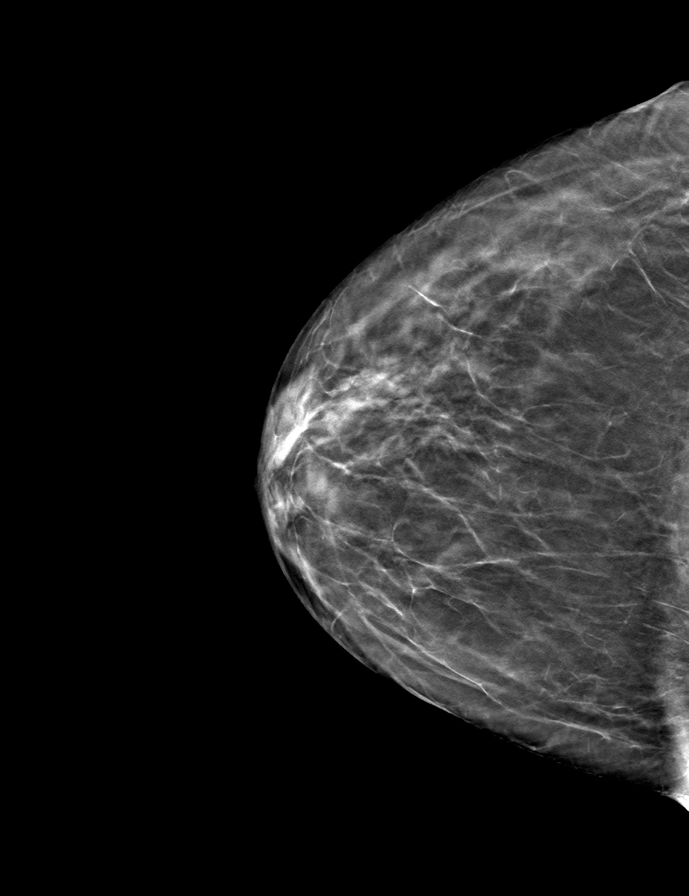

[9 of 24 positions shown; findings below may reference images not displayed]

ACR Breast Density Category b: There are scattered areas of
fibroglandular density.
FINDINGS: There are no findings suspicious for malignancy.
IMPRESSION: No mammographic evidence of malignancy. A result letter of this
screening mammogram will be mailed directly to the patient.

RECOMMENDATION:
Screening mammogram in one year. (Code:51-O-LD2)

BI-RADS CATEGORY  1: Negative.

## 2023-02-28 DIAGNOSIS — D1801 Hemangioma of skin and subcutaneous tissue: Secondary | ICD-10-CM | POA: Diagnosis not present

## 2023-02-28 DIAGNOSIS — L821 Other seborrheic keratosis: Secondary | ICD-10-CM | POA: Diagnosis not present

## 2023-02-28 DIAGNOSIS — Z85828 Personal history of other malignant neoplasm of skin: Secondary | ICD-10-CM | POA: Diagnosis not present

## 2023-02-28 DIAGNOSIS — L738 Other specified follicular disorders: Secondary | ICD-10-CM | POA: Diagnosis not present

## 2023-02-28 DIAGNOSIS — L812 Freckles: Secondary | ICD-10-CM | POA: Diagnosis not present

## 2023-02-28 DIAGNOSIS — L570 Actinic keratosis: Secondary | ICD-10-CM | POA: Diagnosis not present

## 2023-02-28 DIAGNOSIS — L308 Other specified dermatitis: Secondary | ICD-10-CM | POA: Diagnosis not present

## 2023-03-04 DIAGNOSIS — M25562 Pain in left knee: Secondary | ICD-10-CM | POA: Diagnosis not present

## 2023-03-04 DIAGNOSIS — M19071 Primary osteoarthritis, right ankle and foot: Secondary | ICD-10-CM | POA: Diagnosis not present

## 2023-03-04 DIAGNOSIS — M79671 Pain in right foot: Secondary | ICD-10-CM | POA: Diagnosis not present

## 2023-03-04 DIAGNOSIS — M1712 Unilateral primary osteoarthritis, left knee: Secondary | ICD-10-CM | POA: Diagnosis not present

## 2023-03-19 ENCOUNTER — Other Ambulatory Visit: Payer: Self-pay | Admitting: Medical

## 2023-03-19 ENCOUNTER — Telehealth: Payer: Self-pay | Admitting: Medical

## 2023-03-19 DIAGNOSIS — G4700 Insomnia, unspecified: Secondary | ICD-10-CM

## 2023-03-19 MED ORDER — LORAZEPAM 0.5 MG PO TABS: ORAL_TABLET | ORAL | 0 refills | Status: DC

## 2023-03-19 NOTE — Telephone Encounter (Signed)
Pt called and is asking can you refill he LORazepam , she forgot to tell you she also takes this medication at her initial new patient visit.  [id number] [place] [place] - [address]

## 2023-03-25 ENCOUNTER — Telehealth: Payer: Self-pay | Admitting: Medical

## 2023-03-25 NOTE — Telephone Encounter (Signed)
Pt left message [facility]

## 2023-03-26 ENCOUNTER — Other Ambulatory Visit: Payer: Self-pay | Admitting: Medical

## 2023-03-26 MED ORDER — MELOXICAM 7.5 MG PO TABS: 7.5000 mg | ORAL_TABLET | Freq: Every day | ORAL | 0 refills | Status: DC

## 2023-03-27 NOTE — Telephone Encounter (Signed)
done

## 2023-04-10 ENCOUNTER — Ambulatory Visit: Payer: Medicare PPO | Admitting: Nurse Practitioner

## 2023-04-10 ENCOUNTER — Encounter: Payer: Self-pay | Admitting: Nurse Practitioner

## 2023-04-10 VITALS — BP 130/80 | HR 88 | Ht 62.0 in | Wt 117.4 lb

## 2023-04-10 DIAGNOSIS — G8929 Other chronic pain: Secondary | ICD-10-CM | POA: Diagnosis not present

## 2023-04-10 DIAGNOSIS — R1084 Generalized abdominal pain: Secondary | ICD-10-CM

## 2023-04-10 DIAGNOSIS — R1011 Right upper quadrant pain: Secondary | ICD-10-CM

## 2023-04-10 MED ORDER — RIFAXIMIN 550 MG PO TABS: 550.0000 mg | ORAL_TABLET | Freq: Three times a day (TID) | ORAL | 0 refills | Status: AC

## 2023-04-10 NOTE — Progress Notes (Signed)
Assessment and Plan   Primary GI: Erick Blinks, MD  Brief Narrative:  77 y.o. yo female with a past medical history consisting of, but not limited to colon polyps, esophageal candida, diverticulosis, osteopenia.   Chronic postprandial upper abdominal pain   She has had to make several dietary changes but still has episodes after consumption of fried / greasy food. Labs, CT scan , [place], EGD and HIDA were unremarkable. Pain relieved by belching and passage of flatus induced by drinking a mixture of baking soda + water.  -continue daily PPI for now. At some point we may try stopping it -treat empirically for SIBO. Will try Xifaxan 550 mg TID x 14 days   History of Present Illness   Chief complaint: here for follow up on right sided abdominal pain  Charlotte Williams has chronic, intermittent postprandial upper abdominal / RUQ pain.  She was last seen for this on 12/28/2022, please refer to that note for details.  In summary,  she has had an extensive workup including labs, [place], CT scan and EGD. Most recently she had a negative HIDA.  She has made dietary modifications omitting certain culprits such as Etoh, carbonated beverages, and peppermint. This has helped but she continues to get the postprandial abdominal pain, especially after consumption of greasy foods. No issues eating carbohydrates or gluten. The pain runs across upper abdomen and sometimes becomes most pronounced in the RUQ. The pain doesn't radiate through to her back.She doesn't have associated bloating.  The only thing that alleviates the pain is drinking a mixture of baking soda and water which leads to belching and passage of flatus. No other GI symptoms. Bowels are moving okay. Weight is stable  Charlotte Williams had a 6 mm sessile serrated polyp removed at the time of her last colonoscopy in May 2023.  Recall colonoscopy not recommended due to age    Previous GI Endoscopies / Labs / Imaging   HIDA 01/15/23 FINDINGS: Prompt uptake and biliary  excretion of activity by the liver is seen. Gallbladder activity is visualized, consistent with patency of cystic duct. Biliary activity passes into small bowel, consistent with patent common bile duct.   Calculated gallbladder ejection fraction is 66%. (Normal gallbladder ejection fraction with Ensure is greater than 33%.)   IMPRESSION: 1.  Patent cystic and common bile ducts.   2.  Normal gallbladder ejection fraction     Latest Ref Rng & Units 08/09/2022    3:08 PM 02/13/2022    2:56 PM 08/17/2017    9:29 AM  Hepatic Function  Total Protein 6.0 - 8.3 g/dL 6.5  6.7  6.9   Albumin 3.5 - 5.2 g/dL 3.9  4.3  5.1   AST 0 - 37 U/L 21  22  28    ALT 0 - 35 U/L 15  19  22    Alk Phosphatase 39 - 117 U/L 55  54  46   Total Bilirubin 0.2 - 1.2 mg/dL 0.4  0.5  0.4        Latest Ref Rng & Units 08/09/2022    3:08 PM 08/17/2017    9:04 AM 05/11/2008   11:27 AM  CBC  WBC 4.0 - 10.5 K/uL 7.4  9.1    Hemoglobin 12.0 - 15.0 g/dL 54.0  98.1  19.1   Hematocrit 36.0 - 46.0 % 36.8  44.1    Platelets 150.0 - 400.0 K/uL 271.0        Past Medical History:  Diagnosis Date   Adenomatous colon  polyp    Allergy    Arthritis    Diverticulosis    GERD (gastroesophageal reflux disease)    Hypertension    Internal hemorrhoids    Osteopenia     Past Surgical History:  Procedure Laterality Date   APPENDECTOMY     ROTATOR CUFF REPAIR Right    Bicept repair   TONSILLECTOMY AND ADENOIDECTOMY      Current Medications, Allergies, Family History and Social History were reviewed in [employer] record.     Current Outpatient Medications  Medication Sig Dispense Refill   amLODipine (NORVASC) 5 MG tablet Take 2.5 mg by mouth daily.     latanoprost (XALATAN) 0.005 % ophthalmic solution 1 drop at bedtime.     LORazepam (ATIVAN) 0.5 MG tablet TAKE ONE TABLET BY MOUTH AT BEDTIME AS NEEDED FOR ANXIETY OR SLEEP 30 tablet 0   meloxicam (MOBIC) 7.5 MG tablet Take 1 tablet (7.5 mg  total) by mouth daily. 90 tablet 0   pantoprazole (PROTONIX) 40 MG tablet Take 40 mg by mouth daily. PRN (Patient not taking: Reported on 02/12/2023)     No current facility-administered medications for this visit.    Review of Systems: No chest pain. No shortness of breath. No urinary complaints.    Physical Exam  Wt Readings from Last 3 Encounters:  02/12/23 119 lb (54 kg)  12/28/22 118 lb 8 oz (53.8 kg)  11/14/22 120 lb (54.4 kg)    BP 130/80 (BP Location: Left Arm, Patient Position: Sitting, Cuff Size: Normal)   Pulse 88   Ht 5\' 2"  (1.575 m)   Wt 117 lb 6 oz (53.2 kg)   BMI 21.47 kg/m  Constitutional:  Pleasant, generally well appearing female in no acute distress. Psychiatric: Normal mood and affect. Behavior is normal. EENT: Pupils normal.  Conjunctivae are normal. No scleral icterus. Neck supple.  Cardiovascular: Normal rate, regular rhythm.  Pulmonary/chest: Effort normal and breath sounds normal. No wheezing, rales or rhonchi. Abdominal: Soft, nondistended, nontender. Bowel sounds active throughout. There are no masses palpable. No hepatomegaly. Neurological: Alert and oriented to person place and time.  Skin: Skin is warm and dry. No rashes noted.  Charlotte Cluster, NP  04/10/2023, 8:18 AM

## 2023-04-10 NOTE — Patient Instructions (Addendum)
_______________________________________________________  If your blood pressure at your visit was 140/90 or greater, please contact your primary care physician to follow up on this. _______________________________________________________  If you are age 76 or older, your body mass index should be between 23-30. Your Body mass index is 21.47 kg/m. If this is out of the aforementioned range listed, please consider follow up with your Primary Care Provider. ________________________________________________________  The [place] [facility] providers would like to encourage you to use [facility] to communicate with providers for non-urgent requests or questions.  Due to long hold times on the telephone, sending your provider a message by [facility] may be a faster and more efficient way to get a response.  Please allow 48 business hours for a response.  Please remember that this is for non-urgent requests.  _______________________________________________________  We have sent the following medications to your pharmacy for you to pick up at your convenience:  START: Xifaxan 550mg  one tablet three times daily for 14 days.  Please call [facility], LPN if you are unable to get Xifaxan.  Thank you for entrusting me with your care and choosing [facility].  Willette Cluster, NP

## 2023-04-11 ENCOUNTER — Telehealth: Payer: Self-pay | Admitting: Pharmacy Technician

## 2023-04-11 ENCOUNTER — Other Ambulatory Visit (HOSPITAL_COMMUNITY): Payer: Self-pay

## 2023-04-11 NOTE — Telephone Encounter (Signed)
Patient Advocate Encounter  Prior Authorization for [employer] 550MG  has been approved with [employer].    PA# [id number] Effective dates: 1.1.24 through 12.31.24  Per [employer] test claim, UNABLE TO PROVIDE DUE TO BEING FILLED AT PHARMACY ON 5.15.24

## 2023-04-11 NOTE — Telephone Encounter (Signed)
Patient Advocate Encounter  Received notification from [facility] that prior authorization for XIFAXAN 550MG  is required.   PA submitted on 5.16.24 Key [id number] Status is pending

## 2023-04-14 ENCOUNTER — Other Ambulatory Visit: Payer: Self-pay | Admitting: Medical

## 2023-04-15 ENCOUNTER — Telehealth: Payer: Self-pay | Admitting: Nurse Practitioner

## 2023-04-15 NOTE — Telephone Encounter (Signed)
Returned call to patient. Pt states that Burman Blacksmith is [phone] with insurance. Pt states that she did some research on the medication and saw that this was for IBS-D, and she does not have those symptoms. I explained to patient that you were empirically treating her for possible SIBO, Xifaxan is used for off label treatment of this condition. Patient seeking an alternative, not sure if you want to send in flagyl instead? Patient wants something that "is not too strong or expensive". Please advise, thanks.

## 2023-04-15 NOTE — Progress Notes (Signed)
Addendum: Reviewed and agree with assessment and management plan. Shauna Bodkins M, MD  

## 2023-04-15 NOTE — Telephone Encounter (Signed)
Inbound call from patient requesting a call back from a nurse .stated she have some questing in regards the medication that was prescribe.Please advise

## 2023-04-23 NOTE — Telephone Encounter (Signed)
Called and spoke with patient regarding recommendation for alternative medication. Patient states the her son's wife is a NP and she thinks that patient may be having muscle spasms in her abdomen. Patient is interested in trying antispasmodic such as dicyclomine or hyoscyamine. Please advise if OK to send in instead of Flagyl. Thanks

## 2023-05-07 NOTE — Telephone Encounter (Signed)
Spoke with patient. She has had a rubber band feeling around waist after she eats. She is not certain it is or is not a muscle spasm. She said she will pass gas and feel better. Sometimes she can belch and feel better. The patient keeps baking soda on hand for extreme times because "it relieves me instantly." Discussed IBgard with her. Discussed low FODMAP as well. She will let us know how she is doing in a week or more.

## 2023-05-10 ENCOUNTER — Ambulatory Visit
Admission: RE | Admit: 2023-05-10 | Discharge: 2023-05-10 | Disposition: A | Discharge status: Home / Self Care | Payer: Medicare PPO | Source: Ambulatory Visit | Attending: Medical | Admitting: Medical

## 2023-05-10 DIAGNOSIS — M8580 Other specified disorders of bone density and structure, unspecified site: Secondary | ICD-10-CM

## 2023-05-10 DIAGNOSIS — Z1231 Encounter for screening mammogram for malignant neoplasm of breast: Secondary | ICD-10-CM

## 2023-05-10 DIAGNOSIS — Z780 Asymptomatic menopausal state: Secondary | ICD-10-CM

## 2023-05-10 DIAGNOSIS — M810 Age-related osteoporosis without current pathological fracture: Secondary | ICD-10-CM | POA: Diagnosis not present

## 2023-05-10 DIAGNOSIS — Z0000 Encounter for general adult medical examination without abnormal findings: Secondary | ICD-10-CM

## 2023-05-19 NOTE — Progress Notes (Signed)
Schedule visit to discuss bone density results showing osteoporosis

## 2023-05-27 ENCOUNTER — Ambulatory Visit (INDEPENDENT_AMBULATORY_CARE_PROVIDER_SITE_OTHER): Payer: Medicare PPO | Admitting: Medical

## 2023-05-27 VITALS — BP 122/84 | HR 74 | Wt 118.4 lb

## 2023-05-27 DIAGNOSIS — G8929 Other chronic pain: Secondary | ICD-10-CM | POA: Diagnosis not present

## 2023-05-27 DIAGNOSIS — M810 Age-related osteoporosis without current pathological fracture: Secondary | ICD-10-CM | POA: Insufficient documentation

## 2023-05-27 DIAGNOSIS — R109 Unspecified abdominal pain: Secondary | ICD-10-CM | POA: Diagnosis not present

## 2023-05-27 DIAGNOSIS — Z780 Asymptomatic menopausal state: Secondary | ICD-10-CM | POA: Insufficient documentation

## 2023-05-27 MED ORDER — DICYCLOMINE HCL 10 MG PO CAPS: 10.0000 mg | ORAL_CAPSULE | Freq: Two times a day (BID) | ORAL | 0 refills | Status: DC | PRN

## 2023-05-27 NOTE — Patient Instructions (Signed)
Your bone density test shows osteoporosis or low bone mass which puts you at risk for fractures if you fall.  A second result called the FRAX score that predicts your chance of a fracture within the next 10 years shows higher than average risk of a fracture.    Taking medications to treat osteoporosis reduces fracture risk by 50-70%!   1 in 4 American women and 1 in 74 men over age 76 years old have osteoporosis.    Recommendations:   Regular exercise including aerobic and weightbearing exercise, vitamin D supplementation, limiting alcohol and not smoking.    We generally recommend adding a medication at this point to reduce your risk of fracture by reducing the rate of bone turnover or helping to increase bone mass.  Examples would be Fosamax weekly oral medication, Prolia injection every 6 months, or other options.   I want you to consider the options below, then let me know what you may want to do.   All of the medications have potential risks, but they all have the benefit of slowing down the rate of bone turnover as we get older, helping to reduce the risk of a bone fracture.  It is recommended to get 1200mg  daily of calcium and vitamin D at least 2000 units daily.  You have discontinued Protonix reflux medication.  Options include Famotidine Pepcis, Tums, and probiotic daily.   Options for therapy:   Bisphosphonate such as alendronate (Fosamax) This is a medication given weekly that strengthens bones by slowing down the rate of bone loss.  This is usually the first-line medication given the lower cost and does not require injection.  Many people tolerate this medication but some do not.  This medicine has to be taken weekly first thing in the morning on empty stomach, and you cannot lie down for an hour after taking the medication.  Risks of the medication includes trouble swallowing the medication, inflammation of the esophagus, gastric ulcers, and rare risk of breakdown of the jawbone.   These medications are usually given for 5 years (3 years if injectable bisphosphonate like Reclast).   Evenity  Compared with bisphosphonates, this type of medication called monoclonal antibodies produce similar or better bone density results and reduces the chance of all types of fractures. Evenity is delivered via a shot under the skin every month.   Recent research indicates there could be a high risk of spinal column fractures after stopping the drug.  A very rare complication of bisphosphonates and denosumab is a break or crack in the middle of the thighbone.  A second rare complication is delayed healing of the jawbone (osteonecrosis of the jaw). This can occur after an invasive dental procedure such as removing a tooth.   Forteo This medication increases bone density and strength, it is a synthetic version of the parathyroid hormone, and it is given as a daily injection for 2 years.  Risk include leg cramps, nausea, dizziness, elevated calcium, joint pain, and rare risk of bone cancer in animal studies.  Prolia This is an injection given twice yearly that prevents bone dissolving osteoclast cells from forming.  Its a good option for women who can't tolerate bisphosphonate.  The advantages not having to take something every day or every month and it is well-tolerated for the most part.  This type of medication is called a monoclonal antibody, so there are risk of low blood calcium, skin infections, rash, and rarer potential risks are breakdown or death of jawbone,  and rare risk of fracture of the thigh bone.  This medication is given ongoing.  Calcitonin  This is an old drug that helps prevent bone loss, used as a daily nasal spray or injection.  It  reduces spinal fractures, but is not effective in other types of fractures so it is usually not the first-line option.  Side effects can include flushing, rash.  There is a small increase in cancer risk with this medication.  Evista This is a  selective estrogen receptor modulator (SERM), and is used in breast cancer prevention and treatment.  It is also used in treatment of osteoporosis.   It is used in women to reduce risk of vertebral fractures.  Side effects include hot flashes, muscle pain, and increased risks of blood clots in the leg.
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
## 2023-06-04 ENCOUNTER — Other Ambulatory Visit: Payer: Self-pay | Admitting: Medical

## 2023-06-04 ENCOUNTER — Telehealth: Payer: Self-pay | Admitting: Medical

## 2023-06-04 MED ORDER — ALENDRONATE SODIUM 70 MG PO TABS: 70.0000 mg | ORAL_TABLET | ORAL | 11 refills | Status: DC

## 2023-06-04 NOTE — Telephone Encounter (Signed)
Pt called and states that the denist is ok with her starting the fosamax please send to the [id number] - [place], [place] - [address]

## 2023-07-10 ENCOUNTER — Telehealth: Payer: Self-pay | Admitting: Medical

## 2023-07-10 NOTE — Telephone Encounter (Signed)
Please advise if you [place]

## 2023-07-10 NOTE — Telephone Encounter (Signed)
Pt was notified of results she will come in Monday to be seen

## 2023-07-10 NOTE — Telephone Encounter (Signed)
Knee swollen, fluid on knee   she is at [facility]  she wanted to know if you had any recommendations she is not back in town until Saturday

## 2023-07-15 ENCOUNTER — Ambulatory Visit: Payer: Medicare PPO | Admitting: Medical

## 2023-07-16 ENCOUNTER — Encounter: Payer: Self-pay | Admitting: Medical

## 2023-07-16 ENCOUNTER — Ambulatory Visit (INDEPENDENT_AMBULATORY_CARE_PROVIDER_SITE_OTHER): Payer: Medicare PPO | Admitting: Medical

## 2023-07-16 VITALS — BP 136/88 | HR 82 | Ht 62.0 in | Wt 118.4 lb

## 2023-07-16 DIAGNOSIS — M19042 Primary osteoarthritis, left hand: Secondary | ICD-10-CM

## 2023-07-16 DIAGNOSIS — M19041 Primary osteoarthritis, right hand: Secondary | ICD-10-CM | POA: Diagnosis not present

## 2023-07-16 DIAGNOSIS — M25462 Effusion, left knee: Secondary | ICD-10-CM | POA: Diagnosis not present

## 2023-07-16 DIAGNOSIS — M25562 Pain in left knee: Secondary | ICD-10-CM | POA: Diagnosis not present

## 2023-07-16 NOTE — Patient Instructions (Addendum)
Crepitus: Crepitus describes a popping, clicking or crackling sound in a joint. Joint popping sounds - what some may call bone cracking - may mean that air is moving in the joint, which is usually harmless.  People often notice crepitus in their knees in the form of knee cracking, but it can also happen in other joints like the shoulder, elbow or neck.  Crepitus with pain can be a sign of wear and tear or injury. If crepitus is painful, you should consult a doctor.   Your knee exam other than crepitus is normal  Continue your juice + smoothie and your vitamins daily  Consider glucosamine chondroitin supplement daily for arthritis.  Blood pressure should be around 120/70.  Anything over 130/80 is abnormal  Start checking your blood pressure periodically.   If you stay around 120/70, then amlodipine 5mg  is fine.  If running low such as 105/65 or less, then 1/2 tablet daily amlodipine or not any amlodipine.   Amlodipine can help with raynaud's symptoms though.  In the future, for knee pain and swelling, you can use ice /cold therapy 15-20 minutes at a time, knee sleeve or ACE wrap for compression, rest, and leg elevation  Aleve OTC can be used daily or twice daily for a few days in a row for pain and swelling.

## 2023-07-16 NOTE — Progress Notes (Signed)
Subjective:  Charlotte Williams is a 77 y.o. female who presents for Chief Complaint  Patient presents with   Knee Pain    Left leg started feeling stiff last Tuesday and felt like she had fluid on her knee. Pressure and tightness, no pain. Elevated, heat and ice. Heat didn't work but ice did. Then her right leg started feeling the same.      Here for left knee pain and swelling about a week ago when she went to the beach.   Felt tight after she awoke, but had fullness throughout that day.   Tried ice, heat.   Ultimately after using her protein shake and other shake she felt the knee improved.  2 months ago pulled muscle in her front of lower left leg.  No injury, no fall.  No chronic knee issues.  Used ibuprofen last week.  Overall much improved.  Also has questions about the amlodipine dose  No other aggravating or relieving factors.    No other c/o.  Past Medical History:  Diagnosis Date   Adenomatous colon polyp    Allergy    Arthritis    Diverticulosis    GERD (gastroesophageal reflux disease)    Hypertension    Internal hemorrhoids    Osteopenia    Current Outpatient Medications on File Prior to Visit  Medication Sig Dispense Refill   alendronate (FOSAMAX) 70 MG tablet Take 1 tablet (70 mg total) by mouth every 7 (seven) days. Take with a full glass of water on an empty stomach. 4 tablet 11   amLODipine (NORVASC) 5 MG tablet TAKE 1 TABLET (5 MG TOTAL) BY MOUTH DAILY. (Patient taking differently: Take 2.5 mg by mouth daily.) 90 tablet 1   latanoprost (XALATAN) 0.005 % ophthalmic solution 1 drop at bedtime.     meloxicam (MOBIC) 7.5 MG tablet Take 1 tablet (7.5 mg total) by mouth daily. 90 tablet 0   Azelastine HCl 137 MCG/SPRAY SOLN Place 2 sprays into both nostrils as needed. (Patient not taking: Reported on 07/16/2023)     dicyclomine (BENTYL) 10 MG capsule Take 1 capsule (10 mg total) by mouth 2 (two) times daily as needed for spasms. (Patient not taking: Reported on 07/16/2023) 30  capsule 0   LORazepam (ATIVAN) 0.5 MG tablet TAKE ONE TABLET BY MOUTH AT BEDTIME AS NEEDED FOR ANXIETY OR SLEEP (Patient not taking: Reported on 05/27/2023) 30 tablet 0   No current facility-administered medications on file prior to visit.   The following portions of the patient's history were reviewed and updated as appropriate: allergies, current medications, past family history, past medical history, past social history, past surgical history and problem list.  ROS Otherwise as in subjective above     Objective: BP 136/88   Pulse 82   Ht 5\' 2"  (1.575 m)   Wt 118 lb 6.4 oz (53.7 kg)   BMI 21.66 kg/m   BP Readings from Last 3 Encounters:  07/16/23 136/88  05/27/23 122/84  04/10/23 130/80   General appearance: alert, no distress, well developed, well nourished MSK: left knee nontender, no swelling, no laxity, + crepitus though  normal ROM.  Rest of leg nontender Pulses: 2+ radial pulses, 2+ pedal pulses, normal cap refill Ext: no edema Legs neurovascularly intact    Assessment: Encounter Diagnoses  Name Primary?   Left knee pain, unspecified chronicity Yes   Pain and swelling of left knee      Plan: Your knee exam other than crepitus is normal  Continue  your juice + smoothie and your vitamins daily  Consider glucosamine chondroitin supplement daily for arthritis.  Blood pressure should be around 120/70.  Anything over 130/80 is abnormal  Start checking your blood pressure periodically.   If you stay around 120/70, then amlodipine 5mg  is fine.  If running low such as 105/65 or less, then 1/2 tablet daily amlodipine or not any amlodipine.   Amlodipine can help with raynaud's symptoms though.  In the future, for knee pain and swelling, you can use ice /cold therapy 15-20 minutes at a time, knee sleeve or ACE wrap for compression, rest, and leg elevation  Aleve OTC can be used daily or twice daily for a few days in a row for pain and swelling.    Kindall was seen  today for knee pain.  Diagnoses and all orders for this visit:  Left knee pain, unspecified chronicity  Pain and swelling of left knee   Follow up: prn

## 2023-07-26 DIAGNOSIS — H40023 Open angle with borderline findings, high risk, bilateral: Secondary | ICD-10-CM | POA: Diagnosis not present

## 2023-07-31 ENCOUNTER — Other Ambulatory Visit: Payer: Self-pay | Admitting: Medical

## 2023-07-31 ENCOUNTER — Telehealth: Payer: Self-pay

## 2023-07-31 MED ORDER — MELOXICAM 7.5 MG PO TABS: 7.5000 mg | ORAL_TABLET | Freq: Every day | ORAL | 0 refills | Status: DC

## 2023-07-31 NOTE — Telephone Encounter (Signed)
[facility] bc she needed to call her doctor. Last appt. 07/16/23. Please advise.

## 2023-10-10 ENCOUNTER — Ambulatory Visit: Payer: Medicare PPO

## 2023-10-10 DIAGNOSIS — Z0000 Encounter for general adult medical examination without abnormal findings: Secondary | ICD-10-CM

## 2023-10-10 NOTE — Progress Notes (Signed)
Subjective:   Charlotte Williams is a 77 y.o. female who presents for Medicare Annual (Subsequent) preventive examination.  Visit Complete: Virtual I connected with  Rachael Darby on 10/10/23 by a audio enabled telemedicine application and verified that I am speaking with the correct person using two identifiers.  Patient Location: Home  Provider Location: Office/Clinic  I discussed the limitations of evaluation and management by telemedicine. The patient expressed understanding and agreed to proceed.  Vital Signs: Because this visit was a virtual/telehealth visit, some criteria may be missing or patient reported. Any vitals not documented were not able to be obtained and vitals that have been documented are patient reported.  Patient Medicare AWV questionnaire was completed by the patient on 10/07/2023; I have confirmed that all information answered by patient is correct and no changes since this date.  Cardiac Risk Factors include: advanced age (>57men, >36 women);dyslipidemia     Objective:    Today's Vitals   There is no height or weight on file to calculate BMI.     10/10/2023    9:15 AM 10/05/2022    2:31 PM 10/22/2017    8:53 AM  Advanced Directives  Does Patient Have a Medical Advance Directive? Yes Yes Yes  Type of Estate agent of [place];Living will Healthcare Power of [place];Living will Living will  Does patient want to make changes to medical advance directive?   Yes (ED - Information included in AVS)  Copy of Healthcare Power of Attorney in Chart? No - copy requested No - copy requested     Current Medications (verified) Outpatient Encounter Medications as of 10/10/2023  Medication Sig   alendronate (FOSAMAX) 70 MG tablet Take 1 tablet (70 mg total) by mouth every 7 (seven) days. Take with a full glass of water on an empty stomach.   amLODipine (NORVASC) 5 MG tablet TAKE 1 TABLET (5 MG TOTAL) BY MOUTH DAILY. (Patient taking differently:  Take 2.5 mg by mouth daily.)   latanoprost (XALATAN) 0.005 % ophthalmic solution 1 drop at bedtime.   meloxicam (MOBIC) 7.5 MG tablet Take 1 tablet (7.5 mg total) by mouth daily.   Azelastine HCl 137 MCG/SPRAY SOLN Place 2 sprays into both nostrils as needed. (Patient not taking: Reported on 07/16/2023)   dicyclomine (BENTYL) 10 MG capsule Take 1 capsule (10 mg total) by mouth 2 (two) times daily as needed for spasms. (Patient not taking: Reported on 07/16/2023)   LORazepam (ATIVAN) 0.5 MG tablet TAKE ONE TABLET BY MOUTH AT BEDTIME AS NEEDED FOR ANXIETY OR SLEEP (Patient not taking: Reported on 05/27/2023)   No facility-administered encounter medications on file as of 10/10/2023.    Allergies (verified) Codeine and Sulfa antibiotics   History: Past Medical History:  Diagnosis Date   Adenomatous colon polyp    Allergy    Arthritis    Diverticulosis    GERD (gastroesophageal reflux disease)    Hypertension    Internal hemorrhoids    Osteopenia    Past Surgical History:  Procedure Laterality Date   APPENDECTOMY     ROTATOR CUFF REPAIR Right    Bicept repair   TONSILLECTOMY AND ADENOIDECTOMY     Family History  Problem Relation Age of Onset   Endometriosis Daughter    Dementia Mother    Endometriosis Mother    Heart disease Father    Stroke Father    Arthritis Paternal Aunt    Rheum arthritis Paternal Aunt    Colon cancer Neg Hx  Esophageal cancer Neg Hx    Rectal cancer Neg Hx    Stomach cancer Neg Hx    Social History   Socioeconomic History   Marital status: Divorced    Spouse name: Not on file   Number of children: 2   Years of education: Not on file   Highest education level: Bachelor's degree (e.g., BA, AB, BS)  Occupational History   Occupation: Realtor  Tobacco Use   Smoking status: Never   Smokeless tobacco: Never  Vaping Use   Vaping status: Never Used  Substance and Sexual Activity   Alcohol use: Not Currently    Comment: occ   Drug use: No    Sexual activity: Never    Birth control/protection: Post-menopausal  Other Topics Concern   Not on file  Social History Narrative   Lives alone, with her cats.    Her daughter lives in [place], [place].   Her son lives in [place], [place].   Eats healthy, cooks at home, doesn't eat sweets, preservatives   Social Determinants of Health   Financial Resource Strain: Low Risk  (10/07/2023)   Overall Financial Resource Strain (CARDIA)    Difficulty of Paying Living Expenses: Not hard at all  Food Insecurity: No Food Insecurity (10/07/2023)   Hunger Vital Sign    Worried About Running Out of Food in the Last Year: Never true    Ran Out of Food in the Last Year: Never true  Transportation Needs: No Transportation Needs (10/07/2023)   PRAPARE - Administrator, Civil Service (Medical): No    Lack of Transportation (Non-Medical): No  Physical Activity: Sufficiently Active (10/07/2023)   Exercise Vital Sign    Days of Exercise per Week: 5 days    Minutes of Exercise per Session: 40 min  Stress: No Stress Concern Present (10/07/2023)   [employer] of Occupational Health - Occupational Stress Questionnaire    Feeling of Stress : Not at all  Social Connections: Unknown (10/07/2023)   Social Connection and Isolation Panel [NHANES]    Frequency of Communication with Friends and Family: More than three times a week    Frequency of Social Gatherings with Friends and Family: More than three times a week    Attends Religious Services: Not on Marketing executive or Organizations: Yes    Attends Engineer, structural: More than 4 times per year    Marital Status: Divorced    Tobacco Counseling Counseling given: Not Answered   Clinical Intake:  Pre-visit preparation completed: Yes  Pain : No/denies pain     Nutritional Risks: None Diabetes: No  How often do you need to have someone help you when you read instructions, pamphlets, or other written materials  from your doctor or pharmacy?: 1 - Never  Interpreter Needed?: No  Information entered by :: NAllen LPN   Activities of Daily Living    10/07/2023   11:00 AM  In your present state of health, do you have any difficulty performing the following activities:  Hearing? 0  Vision? 0  Difficulty concentrating or making decisions? 0  Walking or climbing stairs? 0  Dressing or bathing? 0  Doing errands, shopping? 0  Preparing Food and eating ? N  Using the Toilet? N  In the past six months, have you accidently leaked urine? N  Do you have problems with loss of bowel control? N  Managing your Medications? N  Managing your Finances? N  Housekeeping or  managing your Housekeeping? N    Patient Care Team: Tysinger, Kermit Balo, PA-C as PCP - General (Family Medicine)  Indicate any recent Medical Services you may have received from other than [facility] providers in the past year (date may be approximate).     Assessment:   This is a routine wellness examination for Bevan.  Hearing/Vision screen Hearing Screening - Comments:: Denies hearing issues Vision Screening - Comments:: Regular eye exams, [facility]   Goals Addressed             This Visit's Progress    Patient Stated       10/10/2023, keep BP and cholesterol down       Depression Screen    10/10/2023    9:16 AM 10/05/2022    2:33 PM 08/16/2022    3:52 PM 11/20/2017   10:15 AM 10/22/2017    8:52 AM 08/20/2017    9:18 AM 08/17/2017    8:30 AM  PHQ 2/9 Scores  PHQ - 2 Score 0 0 0 0 0 0 0  PHQ- 9 Score 0 0         Fall Risk    10/07/2023   11:00 AM 11/14/2022    1:47 PM 10/05/2022    2:33 PM 08/16/2022    3:52 PM 11/20/2017   10:15 AM  Fall Risk   Falls in the past year? 0 0 0 0 No  Number falls in past yr: 0 0 0 0   Injury with Fall? 0 0 0 0   Risk for fall due to : Medication side effect No Fall Risks Medication side effect No Fall Risks   Follow up Falls prevention discussed;Falls evaluation completed  Falls evaluation completed Falls prevention discussed;Education provided;Falls evaluation completed Falls evaluation completed     MEDICARE RISK AT HOME: Medicare Risk at Home Any stairs in or around the home?: Yes If so, are there any without handrails?: No Home free of loose throw rugs in walkways, pet beds, electrical cords, etc?: Yes Adequate lighting in your home to reduce risk of falls?: Yes Life alert?: No Use of a cane, walker or w/c?: No Grab bars in the bathroom?: Yes Shower chair or bench in shower?: No Elevated toilet seat or a handicapped toilet?: No  TIMED UP AND GO:  Was the test performed?  No    Cognitive Function:        10/10/2023    9:17 AM 10/05/2022    2:35 PM  6CIT Screen  What Year? 0 points 0 points  What month? 0 points 0 points  What time? 0 points 0 points  Count back from 20 0 points 0 points  Months in reverse 0 points 0 points  Repeat phrase 2 points 2 points  Total Score 2 points 2 points    Immunizations Immunization History  Administered Date(s) Administered   Fluad Quad(high Dose 65+) 08/16/2022   Influenza,inj,Quad PF,6+ Mos 08/20/2017   PFIZER Comirnaty(Gray Top)Covid-19 Tri-Sucrose Vaccine 07/10/2021   PFIZER(Purple Top)SARS-COV-2 Vaccination 10/11/2020   PNEUMOCOCCAL CONJUGATE-20 07/10/2021, 09/11/2021   Pfizer Covid-19 Vaccine Bivalent Booster 66yrs & up 09/11/2021   Pfizer(Comirnaty)Fall Seasonal Vaccine 12 years and older 11/14/2022    TDAP status: Due, Education has been provided regarding the importance of this vaccine. Advised may receive this vaccine at local pharmacy or Health Dept. Aware to provide a copy of the vaccination record if obtained from local pharmacy or Health Dept. Verbalized acceptance and understanding.  Flu Vaccine status: Due, Education has  been provided regarding the importance of this vaccine. Advised may receive this vaccine at local pharmacy or Health Dept. Aware to provide a copy of the vaccination  record if obtained from local pharmacy or Health Dept. Verbalized acceptance and understanding.  Pneumococcal vaccine status: Up to date  Covid-19 vaccine status: Information provided on how to obtain vaccines.   Qualifies for Shingles Vaccine? Yes   Zostavax completed No   Shingrix Completed?: No.    Education has been provided regarding the importance of this vaccine. Patient has been advised to call insurance company to determine out of pocket expense if they have not yet received this vaccine. Advised may also receive vaccine at local pharmacy or Health Dept. Verbalized acceptance and understanding.  Screening Tests Health Maintenance  Topic Date Due   DTaP/Tdap/Td (1 - Tdap) Never done   Zoster Vaccines- Shingrix (1 of 2) Never done   INFLUENZA VACCINE  06/27/2023   COVID-19 Vaccine (7 - 2023-24 season) 07/28/2023   Medicare Annual Wellness (AWV)  10/09/2024   Pneumonia Vaccine 6+ Years old  Completed   DEXA SCAN  Completed   Hepatitis C Screening  Completed   HPV VACCINES  Aged Out   Colonoscopy  Discontinued    Health Maintenance  Health Maintenance Due  Topic Date Due   DTaP/Tdap/Td (1 - Tdap) Never done   Zoster Vaccines- Shingrix (1 of 2) Never done   INFLUENZA VACCINE  06/27/2023   COVID-19 Vaccine (7 - 2023-24 season) 07/28/2023    Colorectal cancer screening: No longer required.   Mammogram status: Completed 05/10/2023. Repeat every year  Bone Density status: Completed 05/10/2023.   Lung Cancer Screening: (Low Dose CT Chest recommended if Age 58-80 years, 20 pack-year currently smoking OR have quit w/in 15years.) does not qualify.   Lung Cancer Screening Referral: no  Additional Screening:  Hepatitis C Screening: does qualify; Completed 01/10/2017  Vision Screening: Recommended annual ophthalmology exams for early detection of glaucoma and other disorders of the eye. Is the patient up to date with their annual eye exam?  Yes  Who is the provider or what  is the name of the office in which the patient attends annual eye exams? [facility] If pt is not established with a provider, would they like to be referred to a provider to establish care? No .   Dental Screening: Recommended annual dental exams for proper oral hygiene  Diabetic Foot Exam: n/a  Community Resource Referral / Chronic Care Management: CRR required this visit?  No   CCM required this visit?  No     Plan:     I have personally reviewed and noted the following in the patient's chart:   Medical and social history Use of alcohol, tobacco or illicit drugs  Current medications and supplements including opioid prescriptions. Patient is not currently taking opioid prescriptions. Functional ability and status Nutritional status Physical activity Advanced directives List of other physicians Hospitalizations, surgeries, and ER visits in previous 12 months Vitals Screenings to include cognitive, depression, and falls Referrals and appointments  In addition, I have reviewed and discussed with patient certain preventive protocols, quality metrics, and best practice recommendations. A written personalized care plan for preventive services as well as general preventive health recommendations were provided to patient.     Barb Merino, LPN   [id number]   After Visit Summary: (MyChart) Due to this being a telephonic visit, the after visit summary with patients personalized plan was offered to patient via MyChart  Nurse Notes: none

## 2023-10-10 NOTE — Patient Instructions (Signed)
Charlotte Williams , Thank you for taking time to come for your Medicare Wellness Visit. I appreciate your ongoing commitment to your health goals. Please review the following plan we discussed and let me know if I can assist you in the future.   Referrals/Orders/Follow-Ups/Clinician Recommendations: none  This is a list of the screening recommended for you and due dates:  Health Maintenance  Topic Date Due   DTaP/Tdap/Td vaccine (1 - Tdap) Never done   Zoster (Shingles) Vaccine (1 of 2) Never done   Flu Shot  06/27/2023   COVID-19 Vaccine (7 - 2023-24 season) 07/28/2023   Medicare Annual Wellness Visit  10/09/2024   Pneumonia Vaccine  Completed   DEXA scan (bone density measurement)  Completed   Hepatitis C Screening  Completed   HPV Vaccine  Aged Out   Colon Cancer Screening  Discontinued    Advanced directives: (Copy Requested) Please bring a copy of your health care power of attorney and living will to the office to be added to your chart at your convenience.  Next Medicare Annual Wellness Visit scheduled for next year: Yes  Insert Preventive Care attachment Insert FALL PREVENTION attachment if needed

## 2023-11-22 ENCOUNTER — Encounter: Payer: Medicare PPO | Admitting: Medical

## 2023-11-28 ENCOUNTER — Encounter: Payer: Self-pay | Admitting: Medical

## 2023-11-28 ENCOUNTER — Ambulatory Visit: Payer: Medicare PPO | Admitting: Medical

## 2023-11-28 VITALS — BP 110/72 | HR 64 | Ht 62.0 in | Wt 113.2 lb

## 2023-11-28 DIAGNOSIS — E559 Vitamin D deficiency, unspecified: Secondary | ICD-10-CM | POA: Diagnosis not present

## 2023-11-28 DIAGNOSIS — Z23 Encounter for immunization: Secondary | ICD-10-CM | POA: Diagnosis not present

## 2023-11-28 DIAGNOSIS — Z0000 Encounter for general adult medical examination without abnormal findings: Secondary | ICD-10-CM

## 2023-11-28 DIAGNOSIS — L299 Pruritus, unspecified: Secondary | ICD-10-CM

## 2023-11-28 DIAGNOSIS — R634 Abnormal weight loss: Secondary | ICD-10-CM

## 2023-11-28 DIAGNOSIS — R638 Other symptoms and signs concerning food and fluid intake: Secondary | ICD-10-CM

## 2023-11-28 DIAGNOSIS — I7300 Raynaud's syndrome without gangrene: Secondary | ICD-10-CM | POA: Diagnosis not present

## 2023-11-28 DIAGNOSIS — E785 Hyperlipidemia, unspecified: Secondary | ICD-10-CM

## 2023-11-28 DIAGNOSIS — Z780 Asymptomatic menopausal state: Secondary | ICD-10-CM | POA: Diagnosis not present

## 2023-11-28 DIAGNOSIS — F419 Anxiety disorder, unspecified: Secondary | ICD-10-CM | POA: Diagnosis not present

## 2023-11-28 DIAGNOSIS — M810 Age-related osteoporosis without current pathological fracture: Secondary | ICD-10-CM

## 2023-11-28 DIAGNOSIS — Z7185 Encounter for immunization safety counseling: Secondary | ICD-10-CM | POA: Insufficient documentation

## 2023-11-28 DIAGNOSIS — Z1389 Encounter for screening for other disorder: Secondary | ICD-10-CM | POA: Diagnosis not present

## 2023-11-28 LAB — POCT URINALYSIS DIP (PROADVANTAGE DEVICE)
Bilirubin, UA: NEGATIVE
Blood, UA: NEGATIVE
Glucose, UA: NEGATIVE mg/dL
Ketones, POC UA: NEGATIVE mg/dL
Leukocytes, UA: NEGATIVE
Nitrite, UA: NEGATIVE
Protein Ur, POC: NEGATIVE mg/dL
Specific Gravity, Urine: 1.01
Urobilinogen, Ur: NEGATIVE
pH, UA: 7.5 (ref 5.0–8.0)

## 2023-11-28 MED ORDER — ERYTHROMYCIN 5 MG/GM OP OINT: 1.0000 | TOPICAL_OINTMENT | Freq: Every day | OPHTHALMIC | 1 refills | Status: DC

## 2023-11-28 MED ORDER — AMLODIPINE BESYLATE 2.5 MG PO TABS: 2.5000 mg | ORAL_TABLET | Freq: Every day | ORAL | 3 refills | Status: DC

## 2023-11-28 NOTE — Progress Notes (Signed)
 Subjective:   HPI  Charlotte Williams is a 78 y.o. female who presents for Chief Complaint  Patient presents with  . Annual Exam    Fasting cpe- coffee with alittle creamer, having itching ears. Wants to know does she need to take amlopidine 2.5mg  anymore.     Patient Care Team: Elvie Palomo, Alm RAMAN, PA-C as PCP - General (Family Medicine) Pyrtle, Gordy HERO, MD as Consulting Physician (Gastroenterology) Octavia Bruckner, MD as Consulting Physician (Ophthalmology) Joshua Blamer, MD as Attending Physician (Dermatology) Danella Cough, PA-C (Orthopedic Surgery)   Concerns: Itching, see ROS  Lately having some decreased appetite.  No change in stool color, no nausea or vomiting, no abdominal pain.    Lives alone, 2 cats  Past Medical History:  Diagnosis Date  . Adenomatous colon polyp   . Allergy   . Arthritis   . Diverticulosis   . GERD (gastroesophageal reflux disease)   . Hypertension   . Internal hemorrhoids   . Osteopenia     Family History  Problem Relation Age of Onset  . Endometriosis Daughter   . Dementia Mother   . Endometriosis Mother   . Heart disease Father   . Stroke Father   . Arthritis Paternal Aunt   . Rheum arthritis Paternal Aunt   . Colon cancer Neg Hx   . Esophageal cancer Neg Hx   . Rectal cancer Neg Hx   . Stomach cancer Neg Hx      Current Outpatient Medications:  .  alendronate  (FOSAMAX ) 70 MG tablet, Take 1 tablet (70 mg total) by mouth every 7 (seven) days. Take with a full glass of water on an empty stomach., Disp: 4 tablet, Rfl: 11 .  amLODipine  (NORVASC ) 2.5 MG tablet, Take 1 tablet (2.5 mg total) by mouth daily., Disp: 90 tablet, Rfl: 3 .  amLODipine  (NORVASC ) 5 MG tablet, TAKE 1 TABLET (5 MG TOTAL) BY MOUTH DAILY. (Patient taking differently: Take 2.5 mg by mouth daily.), Disp: 90 tablet, Rfl: 1 .  latanoprost (XALATAN) 0.005 % ophthalmic solution, 1 drop at bedtime., Disp: , Rfl:  .  meloxicam  (MOBIC ) 7.5 MG tablet, Take 1 tablet (7.5 mg  total) by mouth daily., Disp: 90 tablet, Rfl: 0 .  Azelastine HCl 137 MCG/SPRAY SOLN, Place 2 sprays into both nostrils as needed. (Patient not taking: Reported on 11/28/2023), Disp: , Rfl:  .  dicyclomine  (BENTYL ) 10 MG capsule, Take 1 capsule (10 mg total) by mouth 2 (two) times daily as needed for spasms. (Patient not taking: Reported on 07/16/2023), Disp: 30 capsule, Rfl: 0 .  erythromycin  ophthalmic ointment, Place 1 Application into both eyes at bedtime., Disp: 3.5 g, Rfl: 1 .  LORazepam  (ATIVAN ) 0.5 MG tablet, TAKE ONE TABLET BY MOUTH AT BEDTIME AS NEEDED FOR ANXIETY OR SLEEP (Patient not taking: Reported on 11/28/2023), Disp: 30 tablet, Rfl: 0  Allergies  Allergen Reactions  . Codeine Itching  . Sulfa Antibiotics Itching   Reviewed their medical, surgical, family, social, medication, and allergy history and updated chart as appropriate.   Review of Systems  Constitutional:  Negative for chills, fever, malaise/fatigue and weight loss.  HENT:  Negative for congestion, ear pain, hearing loss, sore throat and tinnitus.   Eyes:  Negative for blurred vision, pain and redness.  Respiratory:  Negative for cough, hemoptysis and shortness of breath.   Cardiovascular:  Negative for chest pain, palpitations, orthopnea, claudication and leg swelling.  Gastrointestinal:  Negative for abdominal pain, blood in stool, constipation, diarrhea, nausea and vomiting.  Genitourinary:  Negative for dysuria, flank pain, frequency, hematuria and urgency.  Musculoskeletal:  Negative for falls, joint pain and myalgias.  Skin:  Positive for itching. Negative for rash.       Ears and all over lately, ears for a long time, but itchy all over for months.  Has seen dermatology, uses triamcinolone per dermatology.  Using antihistamine tablet for ears.  Uses humidifier.  Has 2 cats   Neurological:  Negative for dizziness, tingling, speech change, weakness and headaches.  Endo/Heme/Allergies:  Negative for polydipsia.  Does not bruise/bleed easily.  Psychiatric/Behavioral:  Negative for depression and memory loss. The patient is not nervous/anxious and does not have insomnia.          10/10/2023    9:16 AM 10/05/2022    2:33 PM 08/16/2022    3:52 PM 11/20/2017   10:15 AM 10/22/2017    8:52 AM  Depression screen PHQ 2/9  Decreased Interest 0 0 0 0 0  Down, Depressed, Hopeless 0 0 0 0 0  PHQ - 2 Score 0 0 0 0 0  Altered sleeping 0 0     Tired, decreased energy 0 0     Change in appetite 0 0     Feeling bad or failure about yourself  0 0     Trouble concentrating 0 0     Moving slowly or fidgety/restless 0 0     Suicidal thoughts 0 0     PHQ-9 Score 0 0     Difficult doing work/chores Not difficult at all Not difficult at all          Objective:  BP 110/72   Pulse 64   Ht 5' 2 (1.575 m)   Wt 113 lb 3.2 oz (51.3 kg)   BMI 20.70 kg/m   BP Readings from Last 3 Encounters:  11/28/23 110/72  07/16/23 136/88  05/27/23 122/84   Wt Readings from Last 3 Encounters:  11/28/23 113 lb 3.2 oz (51.3 kg)  07/16/23 118 lb 6.4 oz (53.7 kg)  05/27/23 118 lb 6.4 oz (53.7 kg)   General appearance: alert, no distress, WD/WN, Caucasian female Skin: small area of pinkish coloration left forehead from  bruise in past month, right ear pinna posterior lateral portion with small area of erythema, slight raised suggestive of recent pustule, no specific crusting or other, otherwise skin unremarkable HEENT: normocephalic, conjunctiva/corneas normal, sclerae anicteric, PERRLA, EOMi, nares patent, no discharge or erythema, pharynx normal Oral cavity: MMM, tongue normal, teeth in good repair Neck: supple, no lymphadenopathy, no thyromegaly, no masses, normal ROM, no bruits Chest: non tender, normal shape and expansion Heart: RRR, normal S1, S2, no murmurs Lungs: CTA bilaterally, no wheezes, rhonchi, or rales Abdomen: +bs, soft, non tender, non distended, no masses, no hepatomegaly, no splenomegaly, no  bruits Back: non tender, normal ROM, no scoliosis Musculoskeletal: upper extremities non tender, no obvious deformity, normal ROM throughout, lower extremities non tender, no obvious deformity, normal ROM throughout Extremities: no edema, no cyanosis, no clubbing Pulses: 2+ symmetric, upper and lower extremities, normal cap refill Neurological: alert, oriented x 3, CN2-12 intact, strength normal upper extremities and lower extremities, sensation normal throughout, DTRs 2+ throughout, no cerebellar signs, gait normal Psychiatric: normal affect, behavior normal, pleasant  Breast/gyn/rectal - deferred to gynecology     Assessment and Plan :   Encounter Diagnoses  Name Primary?  . Encounter for health maintenance examination in adult Yes  . Needs flu shot   . COVID-19 vaccine administered   .  Postmenopausal estrogen deficiency   . Hyperlipidemia, unspecified hyperlipidemia type   . Vitamin D  insufficiency   . Raynaud's phenomenon without gangrene   . Age-related osteoporosis without current pathological fracture   . Anxiety   . Screening for hematuria or proteinuria   . Vaccine counseling   . Itching   . Abnormal food appetite   . Weight loss      This visit was a preventative care visit, also known as wellness visit or routine physical.   Topics typically include healthy lifestyle, diet, exercise, preventative care, vaccinations, sick and well care, proper use of emergency dept and after hours care, as well as other concerns.     Recommendations: Continue to return yearly for your annual wellness and preventative care visits.  This gives us  a chance to discuss healthy lifestyle, exercise, vaccinations, review your chart record, and perform screenings where appropriate.  I recommend you see your eye doctor yearly for routine vision care.  I recommend you see your dentist yearly for routine dental care including hygiene visits twice yearly.   Vaccination recommendations were  reviewed Immunization History  Administered Date(s) Administered  . Fluad Quad(high Dose 65+) 08/16/2022  . Fluad Trivalent(High Dose 65+) 11/28/2023  . Influenza,inj,Quad PF,6+ Mos 08/20/2017  . PFIZER Comirnaty(Gray Top)Covid-19 Tri-Sucrose Vaccine 07/10/2021  . PFIZER(Purple Top)SARS-COV-2 Vaccination 10/11/2020  . PNEUMOCOCCAL CONJUGATE-20 07/10/2021, 09/11/2021  . Pfizer Covid-19 Vaccine Bivalent Booster 28yrs & up 09/11/2021  . Pfizer(Comirnaty)Fall Seasonal Vaccine 12 years and older 11/14/2022, 11/28/2023   Get your Tetanus Tdap booster and Shingrix at your pharmacy and make sure they send us  notification about this.  Counseled on the influenza virus vaccine.  Vaccine information sheet given.  Influenza vaccine given after consent obtained.  Counseled on the Covid virus vaccine.  Vaccine information sheet given.  Covid vaccine given after consent obtained.   Screening for cancer: Colon cancer screening: I reviewed your colonoscopy on file that is up to date from 2023  Breast cancer screening: Mammograms are typically not done after age 76, but if you want to continue screenings you may  Skin cancer screening: Check your skin regularly for new changes, growing lesions, or other lesions of concern Come in for evaluation if you have skin lesions of concern.  Lung cancer screening: If you have a greater than 20 pack year history of tobacco use, then you may qualify for lung cancer screening with a chest CT scan.   Please call your insurance company to inquire about coverage for this test.  We currently don't have screenings for other cancers besides breast, cervical, colon, and lung cancers.  If you have a strong family history of cancer or have other cancer screening concerns, please let me know.    Bone health: Get at least 150 minutes of aerobic exercise weekly Get weight bearing exercise at least once weekly Bone density test:  A bone density test is an imaging test  that uses a type of X-ray to measure the amount of calcium  and other minerals in your bones. The test may be used to diagnose or screen you for a condition that causes weak or thin bones (osteoporosis), predict your risk for a broken bone (fracture), or determine how well your osteoporosis treatment is working. The bone density test is recommended for females 65 and older, or females or males <65 if certain risk factors such as thyroid  disease, long term use of steroids such as for asthma or rheumatological issues, vitamin D  deficiency, estrogen deficiency, family history  of osteoporosis, self or family history of fragility fracture in first degree relative.  04/2023 bone density report showed osteoporosis. Compliant with Fosamax , started in July 2024.    Sometimes forgets to take this.   Heart health: Get at least 150 minutes of aerobic exercise weekly Limit alcohol It is important to maintain a healthy blood pressure and healthy cholesterol numbers  Heart disease screening: Screening for heart disease includes screening for blood pressure, fasting lipids, glucose/diabetes screening, BMI height to weight ratio, reviewed of smoking status, physical activity, and diet.    Goals include blood pressure 120/80 or less, maintaining a healthy lipid/cholesterol profile, preventing diabetes or keeping diabetes numbers under good control, not smoking or using tobacco products, exercising most days per week or at least 150 minutes per week of exercise, and eating healthy variety of fruits and vegetables, healthy oils, and avoiding unhealthy food choices like fried food, fast food, high sugar and high cholesterol foods.    Other tests may possibly include EKG test, CT coronary calcium  score, echocardiogram, exercise treadmill stress test.    Medical care options: I recommend you continue to seek care here first for routine care.  We try really hard to have available appointments Monday through Friday  daytime hours for sick visits, acute visits, and physicals.  Urgent care should be used for after hours and weekends for significant issues that cannot wait till the next day.  The emergency department should be used for significant potentially life-threatening emergencies.  The emergency department is expensive, can often have long wait times for less significant concerns, so try to utilize primary care, urgent care, or telemedicine when possible to avoid unnecessary trips to the emergency department.  Virtual visits and telemedicine have been introduced since the pandemic started in 2020, and can be convenient ways to receive medical care.  We offer virtual appointments as well to assist you in a variety of options to seek medical care.   Advanced Directives: I recommend you consider completing a Health Care Power of Attorney and Living Will.   These documents respect your wishes and help alleviate burdens on your loved ones if you were to become terminally ill or be in a position to need those documents enforced.    You can complete Advanced Directives yourself, have them notarized, then have copies made for our office, for you and for anybody you feel should have them in safe keeping.  Or, you can have an attorney prepare these documents.   If you haven't updated your Last Will and Testament in a while, it may be worthwhile having an attorney prepare these documents together and save on some costs.       Separate significant issues discussed: Osteoporosis-continue Fosamax  begin 2024.  Work on getting weightbearing and aerobic exercise regularly  Hyperlipidemia - updated labs today, eat a low cholesterol diet, and I recommend you begin Crestor  or other medication to lower risk of heart disease and stroke given prior aortic atherosclerosis noted on scan.  Let me know if agreeable to this  Osteoarthritis- you can use oral Tylenol as needed, topical Aspercreme as needed  Itching-labs today,  continue oral antihistamine and topical lotion such as moisturizing lotion daily  Right ear area of skin redness-if this does not resolve within the next 3 to 4 weeks with daily lotion then consider recheck here or with your dermatology  History of Raynaud's-wear warm gloves and layered clothing's in the winter and months when you have symptoms  Weight loss-labs today,  and if you continue to lose weight within the next month then let me know as we may need to pursue other evaluation  Ornella was seen today for annual exam.  Diagnoses and all orders for this visit:  Encounter for health maintenance examination in adult -     Comprehensive metabolic panel -     CBC -     Lipid panel -     POCT Urinalysis DIP (Proadvantage Device) -     Lactate dehydrogenase -     Lipase -     TSH  Needs flu shot -     Flu Vaccine Trivalent High Dose (Fluad)  COVID-19 vaccine administered -     Pfizer Comirnaty Covid -19 Vaccine 67yrs and older  Postmenopausal estrogen deficiency  Hyperlipidemia, unspecified hyperlipidemia type -     Comprehensive metabolic panel -     Lipid panel  Vitamin D  insufficiency  Raynaud's phenomenon without gangrene  Age-related osteoporosis without current pathological fracture  Anxiety  Screening for hematuria or proteinuria -     POCT Urinalysis DIP (Proadvantage Device)  Vaccine counseling  Itching -     Lactate dehydrogenase -     Lipase -     TSH  Abnormal food appetite -     Lactate dehydrogenase -     Lipase -     TSH  Weight loss  Other orders -     erythromycin  ophthalmic ointment; Place 1 Application into both eyes at bedtime. -     amLODipine  (NORVASC ) 2.5 MG tablet; Take 1 tablet (2.5 mg total) by mouth daily.   Follow-up pending labs, yearly for physical

## 2023-11-29 LAB — LIPID PANEL
Chol/HDL Ratio: 2.7 {ratio} (ref 0.0–4.4)
Cholesterol, Total: 217 mg/dL — ABNORMAL HIGH (ref 100–199)
HDL: 79 mg/dL (ref >39)
LDL Chol Calc (NIH): 126 mg/dL — ABNORMAL HIGH (ref 0–99)
Triglycerides: 68 mg/dL (ref 0–149)
VLDL Cholesterol Cal: 12 mg/dL (ref 5–40)

## 2023-11-29 LAB — COMPREHENSIVE METABOLIC PANEL
ALT: 14 [IU]/L (ref 0–32)
AST: 24 [IU]/L (ref 0–40)
Albumin: 4.1 g/dL (ref 3.8–4.8)
Alkaline Phosphatase: 59 [IU]/L (ref 44–121)
BUN/Creatinine Ratio: 21 (ref 12–28)
BUN: 16 mg/dL (ref 8–27)
Bilirubin Total: 0.3 mg/dL (ref 0.0–1.2)
CO2: 24 mmol/L (ref 20–29)
Calcium: 9.1 mg/dL (ref 8.7–10.3)
Chloride: 100 mmol/L (ref 96–106)
Creatinine, Ser: 0.75 mg/dL (ref 0.57–1.00)
Globulin, Total: 2 g/dL (ref 1.5–4.5)
Glucose: 74 mg/dL (ref 70–99)
Potassium: 4.9 mmol/L (ref 3.5–5.2)
Sodium: 138 mmol/L (ref 134–144)
Total Protein: 6.1 g/dL (ref 6.0–8.5)
eGFR: 82 mL/min/{1.73_m2} (ref >59)

## 2023-11-29 LAB — CBC
Hematocrit: 38.3 % (ref 34.0–46.6)
Hemoglobin: 12.4 g/dL (ref 11.1–15.9)
MCH: 30 pg (ref 26.6–33.0)
MCHC: 32.4 g/dL (ref 31.5–35.7)
MCV: 93 fL (ref 79–97)
Platelets: 306 10*3/uL (ref 150–450)
RBC: 4.13 x10E6/uL (ref 3.77–5.28)
RDW: 13.1 % (ref 11.7–15.4)
WBC: 10.7 10*3/uL (ref 3.4–10.8)

## 2023-11-29 LAB — LACTATE DEHYDROGENASE: LDH: 186 [IU]/L (ref 119–226)

## 2023-11-29 LAB — LIPASE: Lipase: 45 U/L (ref 14–85)

## 2023-11-29 LAB — TSH: TSH: 1.06 u[IU]/mL (ref 0.450–4.500)

## 2023-11-29 NOTE — Progress Notes (Signed)
 Results sent through MyChart

## 2023-11-29 NOTE — Progress Notes (Signed)
 You had a CT abdomen pelvis in 2023 that did show aortic atherosclerosis cholesterol plaques.  If you do not want to use a statin we did use Zetia once daily tablet.  This would be probably a good option instead of statin.   Let me know if agreeable to that.

## 2023-12-26 ENCOUNTER — Institutional Professional Consult (permissible substitution): Payer: Medicare PPO | Admitting: Medical

## 2024-01-01 ENCOUNTER — Ambulatory Visit: Payer: Medicare PPO | Admitting: Medical

## 2024-01-01 ENCOUNTER — Encounter: Payer: Self-pay | Admitting: Medical

## 2024-01-01 VITALS — BP 120/70 | HR 74 | Wt 111.0 lb

## 2024-01-01 DIAGNOSIS — Z789 Other specified health status: Secondary | ICD-10-CM

## 2024-01-01 DIAGNOSIS — H6120 Impacted cerumen, unspecified ear: Secondary | ICD-10-CM | POA: Diagnosis not present

## 2024-01-01 DIAGNOSIS — I10 Essential (primary) hypertension: Secondary | ICD-10-CM

## 2024-01-01 DIAGNOSIS — I700 Atherosclerosis of aorta: Secondary | ICD-10-CM

## 2024-01-01 DIAGNOSIS — E785 Hyperlipidemia, unspecified: Secondary | ICD-10-CM

## 2024-01-01 MED ORDER — ROSUVASTATIN CALCIUM 10 MG PO TABS: 10.0000 mg | ORAL_TABLET | Freq: Every day | ORAL | 2 refills | Status: DC

## 2024-01-01 MED ORDER — DEBROX 6.5 % OT SOLN: 5.0000 [drp] | Freq: Every day | OTIC | 0 refills | Status: DC | PRN

## 2024-01-01 NOTE — Progress Notes (Signed)
 Subjective:  Charlotte Williams is a 78 y.o. female who presents for Chief Complaint  Patient presents with   Consult    Discuss cholesterol meds. What can she do to lower cholesterol- she eats healthy and not sure what else she can do. Would like to do the CT calcium  scoring to see if she has plaque buildup     Here to discuss labs that she had been here recently medications and diagnosis.  She does not like to take medicine has not really excited about taking blood pressure or cholesterol medicine.  She has heard bad things about statins.  She is compliant with amlodipine  2.5 mg and has not had any issues with that dose.  Blood pressure has been looking good  She denies any current symptoms of concern.  She also discussed medications and whether she needs to be on cholesterol medicine.  Her father had carotid surgery for atherosclerosis  She is compliant with Fosamax  without side effect or problem  She has some earwax concerns.  She tends to get earwax backed up.  No other aggravating or relieving factors.    No other c/o.  Past Medical History:  Diagnosis Date   Adenomatous colon polyp    Allergy    Aortic atherosclerosis (HCC) 2023   Per CT abdomen pelvis   Arthritis    Diverticulosis    GERD (gastroesophageal reflux disease)    Hypertension    Internal hemorrhoids    Osteopenia    Current Outpatient Medications on File Prior to Visit  Medication Sig Dispense Refill   alendronate  (FOSAMAX ) 70 MG tablet Take 1 tablet (70 mg total) by mouth every 7 (seven) days. Take with a full glass of water on an empty stomach. 4 tablet 11   amLODipine  (NORVASC ) 2.5 MG tablet Take 1 tablet (2.5 mg total) by mouth daily. 90 tablet 3   latanoprost (XALATAN) 0.005 % ophthalmic solution 1 drop at bedtime.     meloxicam  (MOBIC ) 7.5 MG tablet Take 1 tablet (7.5 mg total) by mouth daily. 90 tablet 0   Azelastine HCl 137 MCG/SPRAY SOLN Place 2 sprays into both nostrils as needed. (Patient not  taking: Reported on 11/28/2023)     LORazepam  (ATIVAN ) 0.5 MG tablet TAKE ONE TABLET BY MOUTH AT BEDTIME AS NEEDED FOR ANXIETY OR SLEEP (Patient not taking: Reported on 01/01/2024) 30 tablet 0   No current facility-administered medications on file prior to visit.     The following portions of the patient's history were reviewed and updated as appropriate: allergies, current medications, past family history, past medical history, past social history, past surgical history and problem list.  ROS Otherwise as in subjective above    Objective: BP 120/70   Pulse 74   Wt 111 lb (50.3 kg)   BMI 20.30 kg/m   Wt Readings from Last 3 Encounters:  01/01/24 111 lb (50.3 kg)  11/28/23 113 lb 3.2 oz (51.3 kg)  07/16/23 118 lb 6.4 oz (53.7 kg)   General appearance: alert, no distress, well developed, well nourished Mild to moderate cerumen bilateral ear canal, otherwise HEENT unremarkable  CT abdomen pelvis 03/16/2022 IMPRESSION: 1. No acute findings or explanation for the patient's symptoms. No evidence of intra-malignancy. 2. Distal colonic diverticulosis without evidence of recurrent active inflammation. 3.  Aortic Atherosclerosis (ICD10-I70.0).     Assessment: Encounter Diagnoses  Name Primary?   Aortic atherosclerosis (HCC) Yes   Essential hypertension, benign    LDL-c greater than or equal to 100 mg/dl  Hyperlipidemia, unspecified hyperlipidemia type    Cerumen in auditory canal on examination      Plan: Aortic atherosclerosis-we discussed the findings on CT abdomen pelvis she had 2023.  We discussed that she is eating very healthy and exercising.  We discussed the benefits and risk of using cholesterol medicine and specifically statins.  After discussion she is agreeable to trial of rosuvastatin  Crestor .  Advise she get started out 3 days a week if desired.  Plan to recheck fasting in 6 to 8 weeks  We reviewed her recent labs.  Her HDL looks good which is protective but her  LDL runs little high.  She will begin trial of statin mainly for aortic atherosclerosis finding  Hypertension-controlled on low-dose amlodipine  2.5 mg daily  Osteoporosis-we discussed her last bone density test.  Continue Fosamax  which she is tolerating just fine, started medication July 2024.  We will plan to repeat her bone density test in 2026.  Continue with weightbearing and aerobic exercise  We were going to attempt lavage today but she did not tolerate that.  She will try some Debrox periodically at home   Amarilis was seen today for consult.  Diagnoses and all orders for this visit:  Aortic atherosclerosis (HCC)  Essential hypertension, benign  LDL-c greater than or equal to 100 mg/dl  Hyperlipidemia, unspecified hyperlipidemia type  Cerumen in auditory canal on examination  Other orders -     rosuvastatin  (CRESTOR ) 10 MG tablet; Take 1 tablet (10 mg total) by mouth daily. -     carbamide peroxide (DEBROX) 6.5 % OTIC solution; Place 5 drops into both ears daily as needed.    Follow up: 6 weeks fasting

## 2024-01-03 ENCOUNTER — Other Ambulatory Visit: Payer: Self-pay | Admitting: Medical

## 2024-01-27 ENCOUNTER — Other Ambulatory Visit: Payer: Self-pay | Admitting: Medical

## 2024-01-27 DIAGNOSIS — H40023 Open angle with borderline findings, high risk, bilateral: Secondary | ICD-10-CM | POA: Diagnosis not present

## 2024-01-27 DIAGNOSIS — H02132 Senile ectropion of right lower eyelid: Secondary | ICD-10-CM | POA: Diagnosis not present

## 2024-01-27 DIAGNOSIS — H02135 Senile ectropion of left lower eyelid: Secondary | ICD-10-CM | POA: Diagnosis not present

## 2024-01-27 DIAGNOSIS — H0102B Squamous blepharitis left eye, upper and lower eyelids: Secondary | ICD-10-CM | POA: Diagnosis not present

## 2024-01-27 DIAGNOSIS — H10413 Chronic giant papillary conjunctivitis, bilateral: Secondary | ICD-10-CM | POA: Diagnosis not present

## 2024-01-27 DIAGNOSIS — H2513 Age-related nuclear cataract, bilateral: Secondary | ICD-10-CM | POA: Diagnosis not present

## 2024-01-27 DIAGNOSIS — H0102A Squamous blepharitis right eye, upper and lower eyelids: Secondary | ICD-10-CM | POA: Diagnosis not present

## 2024-01-27 DIAGNOSIS — H04123 Dry eye syndrome of bilateral lacrimal glands: Secondary | ICD-10-CM | POA: Diagnosis not present

## 2024-01-27 DIAGNOSIS — H43813 Vitreous degeneration, bilateral: Secondary | ICD-10-CM | POA: Diagnosis not present

## 2024-01-29 DIAGNOSIS — L821 Other seborrheic keratosis: Secondary | ICD-10-CM | POA: Diagnosis not present

## 2024-01-29 DIAGNOSIS — Z85828 Personal history of other malignant neoplasm of skin: Secondary | ICD-10-CM | POA: Diagnosis not present

## 2024-01-29 DIAGNOSIS — L570 Actinic keratosis: Secondary | ICD-10-CM | POA: Diagnosis not present

## 2024-02-19 ENCOUNTER — Ambulatory Visit: Payer: Medicare PPO | Admitting: Medical

## 2024-02-19 VITALS — BP 120/80 | HR 64 | Wt 112.0 lb

## 2024-02-19 DIAGNOSIS — E785 Hyperlipidemia, unspecified: Secondary | ICD-10-CM | POA: Diagnosis not present

## 2024-02-19 DIAGNOSIS — M810 Age-related osteoporosis without current pathological fracture: Secondary | ICD-10-CM | POA: Diagnosis not present

## 2024-02-19 DIAGNOSIS — I700 Atherosclerosis of aorta: Secondary | ICD-10-CM | POA: Diagnosis not present

## 2024-02-19 DIAGNOSIS — L299 Pruritus, unspecified: Secondary | ICD-10-CM | POA: Diagnosis not present

## 2024-02-19 DIAGNOSIS — I7300 Raynaud's syndrome without gangrene: Secondary | ICD-10-CM

## 2024-02-19 DIAGNOSIS — I10 Essential (primary) hypertension: Secondary | ICD-10-CM | POA: Diagnosis not present

## 2024-02-19 MED ORDER — HYDROXYZINE HCL 10 MG PO TABS: 10.0000 mg | ORAL_TABLET | Freq: Two times a day (BID) | ORAL | 0 refills | Status: DC

## 2024-02-19 NOTE — Progress Notes (Signed)
 Subjective:  Charlotte Williams is a 78 y.o. female who presents for Chief Complaint  Patient presents with   Medical Management of Chronic Issues    Med check- follow-up on cholesterol medication     Here for med check  Hyperlipidemia  -she began Crestor last visit.  She is taking it every day, not just 3 days per week that we initially talked about it. No particular side effect.  Here for labs today  Aortic atherosclerosis discussed previous visit that she is taking Crestor.  Is very active, exercising.   She has been using over-the-counter earwax removal.  She declines lavage  She feels itchy in general quite regularly.  She has itching on her face, ears, abdomen but no rash.  She is compliant with Fosamax for osteoporosis, started this 2024  No other aggravating or relieving factors.    No other c/o.  Past Medical History:  Diagnosis Date   Adenomatous colon polyp    Allergy    Aortic atherosclerosis (HCC) 2023   Per CT abdomen pelvis   Arthritis    Diverticulosis    GERD (gastroesophageal reflux disease)    Hypertension    Internal hemorrhoids    Osteopenia    Current Outpatient Medications on File Prior to Visit  Medication Sig Dispense Refill   alendronate (FOSAMAX) 70 MG tablet Take 1 tablet (70 mg total) by mouth every 7 (seven) days. Take with a full glass of water on an empty stomach. 4 tablet 11   amLODipine (NORVASC) 2.5 MG tablet Take 1 tablet (2.5 mg total) by mouth daily. 90 tablet 3   Azelastine HCl 137 MCG/SPRAY SOLN Place 2 sprays into both nostrils as needed.     latanoprost (XALATAN) 0.005 % ophthalmic solution 1 drop at bedtime.     LORazepam (ATIVAN) 0.5 MG tablet TAKE ONE TABLET BY MOUTH AT BEDTIME AS NEEDED FOR ANXIETY OR SLEEP 30 tablet 0   meloxicam (MOBIC) 7.5 MG tablet TAKE 1 TABLET BY MOUTH EVERY DAY 90 tablet 0   rosuvastatin (CRESTOR) 10 MG tablet Take 1 tablet (10 mg total) by mouth daily. 30 tablet 2   carbamide peroxide (DEBROX) 6.5 %  OTIC solution Place 5 drops into both ears daily as needed. 15 mL 0   No current facility-administered medications on file prior to visit.     The following portions of the patient's history were reviewed and updated as appropriate: allergies, current medications, past family history, past medical history, past social history, past surgical history and problem list.  ROS Otherwise as in subjective above     Objective: BP 120/80   Pulse 64   Wt 112 lb (50.8 kg)   BMI 20.49 kg/m   Wt Readings from Last 3 Encounters:  02/19/24 112 lb (50.8 kg)  01/01/24 111 lb (50.3 kg)  11/28/23 113 lb 3.2 oz (51.3 kg)   BP Readings from Last 3 Encounters:  02/19/24 120/80  01/01/24 120/70  11/28/23 110/72    General appearance: alert, no distress, well developed, well nourished TMs pearly, minimal cerumen in right ear canal Neck: supple, no lymphadenopathy, no thyromegaly, no masses Heart: RRR, normal S1, S2, no murmurs Lungs: CTA bilaterally, no wheezes, rhonchi, or rales Pulses: 2+ radial pulses, 2+ pedal pulses, normal cap refill Ext: no edema    Assessment: Encounter Diagnoses  Name Primary?   Hyperlipidemia, unspecified hyperlipidemia type Yes   Essential hypertension, benign    Raynaud's phenomenon without gangrene    Age-related osteoporosis without current  pathological fracture    Aortic atherosclerosis (HCC)    Itching      Plan: Hyperlipidemia and aortic atherosclerosis - updated labs today after starting Crestor 10mg  daily about 6+ weeks ago.  Tolerating crestor ok.    Hypertension - she will consider stopping amlodipine 2.5mg  but monitor BP to make sure not running elevated.  She was on 5mg  prior but got lightheaded with this  Raynauds - discussed benefit of amlodipine in raynauds  Osteoporosis - doing ok on fosamax, active, getting exercise, plan repeat bone density test 04/2025  Itching -I recommend daily moisturizing lotion.  Try hydroxyzine once or twice a  day for itching.  Caution with sedation.   Charlotte Williams was seen today for medical management of chronic issues.  Diagnoses and all orders for this visit:  Hyperlipidemia, unspecified hyperlipidemia type -     Lipid panel -     ALT  Essential hypertension, benign  Raynaud's phenomenon without gangrene  Age-related osteoporosis without current pathological fracture  Aortic atherosclerosis (HCC)  Itching  Other orders -     hydrOXYzine (ATARAX) 10 MG tablet; Take 1 tablet (10 mg total) by mouth in the morning and at bedtime.   Follow up: pending labs

## 2024-02-20 ENCOUNTER — Other Ambulatory Visit: Payer: Self-pay | Admitting: Medical

## 2024-02-20 LAB — LIPID PANEL
Chol/HDL Ratio: 2 ratio (ref 0.0–4.4)
Cholesterol, Total: 153 mg/dL (ref 100–199)
HDL: 77 mg/dL (ref >39)
LDL Chol Calc (NIH): 61 mg/dL (ref 0–99)
Triglycerides: 77 mg/dL (ref 0–149)
VLDL Cholesterol Cal: 15 mg/dL (ref 5–40)

## 2024-02-20 LAB — ALT: ALT: 14 IU/L (ref 0–32)

## 2024-02-20 MED ORDER — ROSUVASTATIN CALCIUM 10 MG PO TABS: 10.0000 mg | ORAL_TABLET | Freq: Every day | ORAL | 2 refills | Status: DC

## 2024-02-20 NOTE — Progress Notes (Signed)
 Results sent through MyChart

## 2024-03-13 ENCOUNTER — Other Ambulatory Visit: Payer: Self-pay | Admitting: Medical

## 2024-03-14 ENCOUNTER — Other Ambulatory Visit: Payer: Self-pay | Admitting: Medical

## 2024-03-16 ENCOUNTER — Encounter: Payer: Self-pay | Admitting: Internal Medicine

## 2024-03-25 DIAGNOSIS — D1801 Hemangioma of skin and subcutaneous tissue: Secondary | ICD-10-CM | POA: Diagnosis not present

## 2024-03-25 DIAGNOSIS — L812 Freckles: Secondary | ICD-10-CM | POA: Diagnosis not present

## 2024-03-25 DIAGNOSIS — D692 Other nonthrombocytopenic purpura: Secondary | ICD-10-CM | POA: Diagnosis not present

## 2024-03-25 DIAGNOSIS — H61001 Unspecified perichondritis of right external ear: Secondary | ICD-10-CM | POA: Diagnosis not present

## 2024-03-25 DIAGNOSIS — Z85828 Personal history of other malignant neoplasm of skin: Secondary | ICD-10-CM | POA: Diagnosis not present

## 2024-03-25 DIAGNOSIS — L821 Other seborrheic keratosis: Secondary | ICD-10-CM | POA: Diagnosis not present

## 2024-04-26 ENCOUNTER — Other Ambulatory Visit: Payer: Self-pay | Admitting: Medical

## 2024-05-08 ENCOUNTER — Other Ambulatory Visit: Payer: Self-pay | Admitting: Medical

## 2024-05-08 DIAGNOSIS — G4700 Insomnia, unspecified: Secondary | ICD-10-CM

## 2024-05-14 DIAGNOSIS — D649 Anemia, unspecified: Secondary | ICD-10-CM | POA: Diagnosis not present

## 2024-05-14 DIAGNOSIS — R55 Syncope and collapse: Secondary | ICD-10-CM | POA: Diagnosis not present

## 2024-05-14 DIAGNOSIS — I10 Essential (primary) hypertension: Secondary | ICD-10-CM | POA: Diagnosis not present

## 2024-05-14 DIAGNOSIS — R69 Illness, unspecified: Secondary | ICD-10-CM | POA: Diagnosis not present

## 2024-05-14 DIAGNOSIS — R319 Hematuria, unspecified: Secondary | ICD-10-CM | POA: Diagnosis not present
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
## 2024-05-27 ENCOUNTER — Ambulatory Visit: Admitting: Medical

## 2024-05-27 ENCOUNTER — Ambulatory Visit: Attending: Medical

## 2024-05-27 VITALS — BP 124/80 | HR 85 | Wt 111.4 lb

## 2024-05-27 DIAGNOSIS — I7300 Raynaud's syndrome without gangrene: Secondary | ICD-10-CM

## 2024-05-27 DIAGNOSIS — E785 Hyperlipidemia, unspecified: Secondary | ICD-10-CM | POA: Diagnosis not present

## 2024-05-27 DIAGNOSIS — R55 Syncope and collapse: Secondary | ICD-10-CM

## 2024-05-27 DIAGNOSIS — E871 Hypo-osmolality and hyponatremia: Secondary | ICD-10-CM

## 2024-05-27 DIAGNOSIS — R3129 Other microscopic hematuria: Secondary | ICD-10-CM | POA: Diagnosis not present

## 2024-05-27 DIAGNOSIS — I10 Essential (primary) hypertension: Secondary | ICD-10-CM

## 2024-05-27 DIAGNOSIS — I700 Atherosclerosis of aorta: Secondary | ICD-10-CM | POA: Diagnosis not present

## 2024-05-27 LAB — MICROSCOPIC EXAMINATION

## 2024-05-27 LAB — CBC

## 2024-05-27 NOTE — Progress Notes (Unsigned)
 EP to read.
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
## 2024-05-27 NOTE — Patient Instructions (Signed)
 Recommendations Drink at least 80 to 100 ounces of water daily Hold off on the amlodipine  for now.  We were using this medicine for blood pressure and Raynaud's If your blood pressure starts going back over 130/80 and we may need to add back blood pressure medication but for now lets hold off Limit tea more so than you for doing We will call with lab results and urine results We will have you go and start the ZIO monitor heart monitor device for 1 week After you turn in, then call our office in about 4 to 5 days to make sure we got the result

## 2024-05-28 ENCOUNTER — Ambulatory Visit: Payer: Self-pay | Admitting: Medical

## 2024-05-28 LAB — CBC
Hematocrit: 38.1 (ref 34.0–46.6)
Hemoglobin: 12.5 g/dL (ref 11.1–15.9)
MCH: 30.9 pg (ref 26.6–33.0)
MCHC: 32.8 g/dL (ref 31.5–35.7)
MCV: 94 fL (ref 79–97)
Platelets: 216 10*3/uL (ref 150–450)
RBC: 4.05 x10E6/uL (ref 3.77–5.28)
RDW: 12.7 (ref 11.7–15.4)
WBC: 6.5 10*3/uL (ref 3.4–10.8)

## 2024-05-28 LAB — URINALYSIS, ROUTINE W REFLEX MICROSCOPIC
Bilirubin, UA: NEGATIVE
Glucose, UA: NEGATIVE
Ketones, UA: NEGATIVE
Leukocytes,UA: NEGATIVE
Nitrite, UA: NEGATIVE
Protein,UA: NEGATIVE
Specific Gravity, UA: 1.007 (ref 1.005–1.030)
Urobilinogen, Ur: 0.2 mg/dL (ref 0.2–1.0)
pH, UA: 6.5 (ref 5.0–7.5)

## 2024-05-28 LAB — MICROSCOPIC EXAMINATION
Casts: NONE SEEN
Epithelial Cells (non renal): NONE SEEN /HPF (ref 0–10)
RBC, Urine: NONE SEEN /HPF (ref 0–2)
Renal Epithel, UA: NONE SEEN /LPF
WBC, UA: NONE SEEN /HPF (ref 0–5)

## 2024-05-28 LAB — BASIC METABOLIC PANEL WITH GFR
BUN/Creatinine Ratio: 20 (ref 12–28)
BUN: 14 mg/dL (ref 8–27)
CO2: 19 mmol/L — ABNORMAL LOW (ref 20–29)
Calcium: 9 mg/dL (ref 8.7–10.3)
Chloride: 99 mmol/L (ref 96–106)
Creatinine, Ser: 0.71 mg/dL (ref 0.57–1.00)
Glucose: 93 mg/dL (ref 70–99)
Potassium: 4.8 mmol/L (ref 3.5–5.2)
Sodium: 134 mmol/L (ref 134–144)
eGFR: 88 mL/min/{1.73_m2} (ref >59)

## 2024-05-28 NOTE — Progress Notes (Signed)
Results sent through mychart 

## 2024-06-18 DIAGNOSIS — R55 Syncope and collapse: Secondary | ICD-10-CM | POA: Diagnosis not present

## 2024-06-29 ENCOUNTER — Telehealth: Payer: Self-pay | Admitting: Internal Medicine

## 2024-06-29 NOTE — Telephone Encounter (Signed)
 I have sent a message to the tech ETTER Woodie Prudent) to see if we can get the final result on this

## 2024-06-29 NOTE — Telephone Encounter (Signed)
 See Media 06/18/24 as there is heart monitor results  Copied from CRM #[id number]. Topic: Clinical - Lab/Test Results >> Jun 29, 2024  9:28 AM Graeme ORN wrote: Reason for CRM: Patient called states she wore heart monitor and males it back shows it was delivered in July but she has not heard anything back. Let her know - not yet finalized. PT would like a call back with update.

## 2024-06-30 DIAGNOSIS — R55 Syncope and collapse: Secondary | ICD-10-CM | POA: Diagnosis not present

## 2024-07-02 ENCOUNTER — Ambulatory Visit: Admitting: Medical

## 2024-07-02 VITALS — BP 128/80 | HR 80 | Wt 115.4 lb

## 2024-07-02 DIAGNOSIS — E785 Hyperlipidemia, unspecified: Secondary | ICD-10-CM | POA: Diagnosis not present

## 2024-07-02 DIAGNOSIS — M62838 Other muscle spasm: Secondary | ICD-10-CM

## 2024-07-02 DIAGNOSIS — M542 Cervicalgia: Secondary | ICD-10-CM

## 2024-07-02 DIAGNOSIS — I4892 Unspecified atrial flutter: Secondary | ICD-10-CM | POA: Diagnosis not present

## 2024-07-02 DIAGNOSIS — I10 Essential (primary) hypertension: Secondary | ICD-10-CM | POA: Diagnosis not present

## 2024-07-02 DIAGNOSIS — I499 Cardiac arrhythmia, unspecified: Secondary | ICD-10-CM

## 2024-07-02 DIAGNOSIS — M791 Myalgia, unspecified site: Secondary | ICD-10-CM | POA: Diagnosis not present

## 2024-07-02 DIAGNOSIS — I700 Atherosclerosis of aorta: Secondary | ICD-10-CM

## 2024-07-02 MED ORDER — APIXABAN 5 MG PO TABS: 5.0000 mg | ORAL_TABLET | Freq: Two times a day (BID) | ORAL | 2 refills | Status: DC

## 2024-07-02 NOTE — Telephone Encounter (Signed)
 Patient was notified of results from visit with Ludie today

## 2024-07-02 NOTE — Progress Notes (Signed)
 ZIO Heart Monitor Results  NSR with lower rate of 63/min and upper rate of 142/min, ave HR 99/min. Sustained atrial fib with a RVR present, with 20% burden, longest episode 24 hours.  With rates from 86 to 239/min., associated with with symptoms. Ave rate in atrial fib was 141/min. Episodes of NS SVT present vs atrial flutter with a RVR at rates up to 261/min  (likely 1:1 flutter) No prolonged pauses 2% PAC's and 1% PVC's NSVT is present though could also be abherrently conducted aflutter. Symptoms associated with atrial fib.  Patch Wear Time:  13 days and 21 hours (2025-07-07T17:14:54-0400 to 2025-07-21T15:06:31-0400)

## 2024-07-02 NOTE — Patient Instructions (Signed)
 Your recent heart monitor showed some arrhythmia or abnormal heart rhythm including atrial fibrillation and atrial flutter  This was not all the time but is happening enough that you need to initiate treatment and evaluation  Begin Eliquis  anticoagulant to help prevent blood clot.  This is 5 mg twice daily.  I gave you samples today  Stop meloxicam  and avoid any anti-inflammatories such as ibuprofen, Aleve, Advil or Motrin as this can increase your bleeding risk on the Eliquis   Temporarily since you are having muscle aches, stop the rosuvastatin  Crestor  for 1 or 2 weeks.  Let me know within the next 2 weeks whether the aches go away or not.  If the aches do not resolve then stop the Crestor  causing the problem and you can resume the Crestor .  If stopping the Crestor  alleviates the aches then we need to try different medication for cholesterol   Expect a phone call about referral to cardiac electrophysiology/A-fib clinic for further evaluation and treatment recommendations  Be good about your water intake every day  Limit caffeine and alcohol as they can trigger an arrhythmia such as A-fib  Atrial Fibrillation Atrial fibrillation (AFib) is a type of heartbeat that is irregular or fast. If you have AFib, your heart beats without any order. This makes it hard for your heart to pump blood in a normal way. AFib may come and go, or it may become a long-lasting problem. If AFib is not treated, it can put you at higher risk for stroke, heart failure, and other heart problems. What are the causes? AFib may be caused by diseases that damage the heart's electrical system. They include: High blood pressure. Heart failure. Heart valve diseases. Heart surgery. Diabetes. Thyroid  disease. Kidney disease. Lung diseases, such as pneumonia or COPD. Sleep apnea. Sometimes the cause is not known. What increases the risk? You are more likely to develop AFib if: You are older. You exercise often and very  hard. You have a family history of AFib. You are female. You are Caucasian. You are overweight. You smoke. You drink a lot of alcohol. What are the signs or symptoms? Common symptoms of this condition include: A feeling that your heart is beating very fast. Chest pain or discomfort. Feeling short of breath. Suddenly feeling light-headed or weak. Getting tired easily during activity. Fainting. Sweating. In some cases, there are no symptoms. How is this treated? Medicines to: Prevent blood clots. Treat heart rate or heart rhythm problems. Using devices, such as a pacemaker, to correct heart rhythm problems. Doing surgery to remove the part of the heart that sends bad signals. Closing an area where clots can form in the heart (left atrial appendage). In some cases, your doctor will treat other underlying conditions. Follow these instructions at home: Medicines Take over-the-counter and prescription medicines only as told by your doctor. Do not take any new medicines without first talking to your doctor. If you are taking blood thinners: Talk with your doctor before taking aspirin or NSAIDs, such as ibuprofen. Take your medicines as told. Take them at the same time each day. Do not do things that could hurt or bruise you. Be careful to avoid falls. Wear an alert bracelet or carry a card that says you take blood thinners. Lifestyle Do not smoke or use any products that contain nicotine or tobacco. If you need help quitting, ask your doctor. Eat heart-healthy foods. Talk with your doctor about the right eating plan for you. Exercise regularly as told by your  doctor. Do not drink alcohol. Lose weight if you are overweight. General instructions If you have sleep apnea, treat it as told by your doctor. Do not use diet pills unless your doctor says they are safe for you. Diet pills may make heart problems worse. Keep all follow-up visits. Your doctor will check your heart rate and  rhythm regularly. Contact a doctor if: You notice a change in the speed, rhythm, or strength of your heartbeat. You are taking a blood-thinning medicine and you get more bruising. You get tired more easily when you move or exercise. You have a sudden change in weight. Get help right away if:  You have pain in your chest. You have trouble breathing. You have side effects of blood thinners, such as blood in your vomit, poop (stool), or pee (urine), or bleeding that cannot stop. You have any signs of a stroke. BE FAST is an easy way to remember the main warning signs: B - Balance. Dizziness, sudden trouble walking, or loss of balance. E - Eyes. Trouble seeing or a change in how you see. F - Face. Sudden weakness or loss of feeling in the face. The face or eyelid may droop on one side. A - Arms.Weakness or loss of feeling in an arm. This happens suddenly and usually on one side of the body. S - Speech. Sudden trouble speaking, slurred speech, or trouble understanding what people say. T - Time.Time to call emergency services. Write down what time symptoms started. You have other signs of a stroke, such as: A sudden, very bad headache with no known cause. Feeling like you may vomit (nausea). Vomiting. A seizure. These symptoms may be an emergency. Get help right away. Call 911. Do not wait to see if the symptoms will go away. Do not drive yourself to the hospital. This information is not intended to replace advice given to you by your health care provider. Make sure you discuss any questions you have with your health care provider. Document Revised: 08/01/2022 Document Reviewed: 08/01/2022 Elsevier Patient Education  2024 [employer].

## 2024-07-02 NOTE — Progress Notes (Signed)
 Subjective:  Charlotte Williams is a 78 y.o. female who presents for Chief Complaint  Patient presents with   Follow-up    Follow-up on heart monitor and bp     Here for follow-up.  I saw her 05/27/2024 after hospital visit for syncope.  At last visit we referred her for ZIO monitor for evaluation of possible arrhythmia.  Here to discuss abnormal results.   Prior to her last visit she was in [place]  in June and had an episode of syncope while standing at a dinner table surrounded by other gas.  Being seen in the emergency department.  It was thought that she had been dehydrated and overheated due to hot temperatures leading up to that emergency department visit.  She was discontinued on amlodipine  at that time as well which she was using for Raynaud's and blood pressure.  She notes that while wearing the zio, she did pressure the button when she felt either tightness in neck or unusual beats or her smart watch was showing pulse over 100 at rest.    No recent chest pain, syncope, lightheaded or edema.   She notes some neck crick on the left, achy neck.   No injury or trauma.  No other aggravating or relieving factors.    No other c/o.  Past Medical History:  Diagnosis Date   Adenomatous colon polyp    Allergy    Aortic atherosclerosis (HCC) 2023   Per CT abdomen pelvis   Arthritis    Diverticulosis    GERD (gastroesophageal reflux disease)    Hypertension    Internal hemorrhoids    Osteopenia    Current Outpatient Medications on File Prior to Visit  Medication Sig Dispense Refill   alendronate  (FOSAMAX ) 70 MG tablet TAKE 1 TABLET (70 MG TOTAL) BY MOUTH EVERY 7 DAYS WITH FULL GLASS WATER ON EMPTY STOMACH 12 tablet 1   Azelastine HCl 137 MCG/SPRAY SOLN Place 2 sprays into both nostrils as needed.     carbamide peroxide (DEBROX) 6.5 % OTIC solution Place 5 drops into both ears daily as needed. 15 mL 0   hydrOXYzine  (ATARAX ) 10 MG tablet TAKE 1 TABLET (10 MG TOTAL) BY MOUTH IN THE  MORNING AND AT BEDTIME 180 tablet 1   latanoprost (XALATAN) 0.005 % ophthalmic solution 1 drop at bedtime.     LORazepam  (ATIVAN ) 0.5 MG tablet TAKE ONE TABLET BY MOUTH AT BEDTIME AS NEEDED FOR ANXIETY OR SLEEP 30 tablet 0   No current facility-administered medications on file prior to visit.     The following portions of the patient's history were reviewed and updated as appropriate: allergies, current medications, past family history, past medical history, past social history, past surgical history and problem list.  ROS Otherwise as in subjective above    Objective: BP 128/80   Pulse 80   Wt 115 lb 6.4 oz (52.3 kg)   BMI 21.11 kg/m   General appearance: alert, no distress, well developed, well nourished Neck: supple, no lymphadenopathy, no thyromegaly, no masses Heart: RRR, normal S1, S2, no murmurs Lungs: CTA bilaterally, no wheezes, rhonchi, or rales Neck: mild left lateral tenderness, otherwise nontender and no mass, mildly reduced left ROM Pulses: 2+ radial pulses, 2+ pedal pulses, normal cap refill Ext: no edema   Assessment: Encounter Diagnoses  Name Primary?   Cardiac arrhythmia, unspecified cardiac arrhythmia type Yes   Atrial flutter, unspecified type (HCC)    Neck pain    Neck muscle spasm  Aortic atherosclerosis (HCC)    Essential hypertension, benign    Hyperlipidemia, unspecified hyperlipidemia type    Myalgia      Plan: We discussed her recent ZIO monitor that showed A-fib/a flutter 20% of the time and did correspond to some of her triggered events.  We discussed diagnosis of atrial fibrillation/a flutter, possible risk.  CHADS2 score of 4.  We will initiate anticoagulation.  We discussed options for therapy.  Begin samples of Eliquis  and medicine sent to the pharmacy.  We discussed risk and benefits of medication including bleeding risk.  She is on meloxicam  for arthritis pain.  Discontinue anti-inflammatories in general  No other medicine  initiated.  Her heart rate is not tachycardic.  We will defer the next treatment recommendations to cardiology  Referral urgently to electrophysiology/A-fib clinic  We discussed symptoms that prompt evaluation of the emergency department.  Hyperlipidemia-she has been doing rosuvastatin  Crestor  but having a lot of myalgias in the legs.  Discontinue rosuvastatin  short-term over the next 2 weeks to see if the myalgias resolve or not.  If the myalgias do not she will resume rosuvastatin .  If the symptoms do go away we will change to a different cholesterol medicine.  Referral to physical therapy for cervical muscle spasm and neck pain unrelated.   Charlotte Williams was seen today for follow-up.  Diagnoses and all orders for this visit:  Cardiac arrhythmia, unspecified cardiac arrhythmia type -     Ambulatory referral to Cardiac Electrophysiology  Atrial flutter, unspecified type (HCC) -     Ambulatory referral to Cardiac Electrophysiology  Neck pain -     Ambulatory referral to Physical Therapy  Neck muscle spasm -     Ambulatory referral to Physical Therapy  Aortic atherosclerosis (HCC)  Essential hypertension, benign  Hyperlipidemia, unspecified hyperlipidemia type  Myalgia  Other orders -     apixaban  (ELIQUIS ) 5 MG TABS tablet; Take 1 tablet (5 mg total) by mouth 2 (two) times daily.    Follow up: pending electrophysiology consult, call back within 2 weeks regarding stopping the crestor 

## 2024-07-06 ENCOUNTER — Other Ambulatory Visit: Payer: Self-pay | Admitting: Medical

## 2024-07-06 ENCOUNTER — Telehealth: Payer: Self-pay | Admitting: Internal Medicine

## 2024-07-06 MED ORDER — RIVAROXABAN (XARELTO) VTE STARTER PACK (15 & 20 MG): ORAL_TABLET | ORAL | 0 refills | Status: DC

## 2024-07-06 NOTE — Telephone Encounter (Signed)
 Patient called back to check status. Read note as written. Let her know Rx sent. She will pick up Thursday when she Is back in town. Would also like to discuss restarting meloxicam .

## 2024-07-06 NOTE — Telephone Encounter (Signed)
 Copied from CRM [id number]. Topic: Clinical - Prescription Issue >> Jul 03, 2024  2:54 PM Fonda T wrote: Reason for CRM: Received call from patient, reports provider started her on medication, apixaban  (ELIQUIS ) 5 MG TABS tablet, went to pharmacy to pick up medication and was too expensive, around $430.  Patient states she was given a 7 day sample supply, but would like to know what alternative can be given, or if an authorization can be done to get medication at a lower cost.  Patient states she will be leaving to go out of town on tomorrow, 07/04/24, and will be returning around Friday 07/03/14/25.  Patient requesting a follow up call to discuss alternatives, can be reached at [phone].  Pharmacy:  [facility] [address] [place] - [address] [place] [address] Phone: [phone] Fax: [phone]

## 2024-07-06 NOTE — Telephone Encounter (Signed)
 Pt was notified. She wants to know can she take Tylenol everyday if she can't take the meloixcam? She would like her lipids checked to see what her cholesterol numbers are. She doesn't think the crestor  was causing her aches and pains, so she might need to go back on it but wants her lab done first.

## 2024-07-07 ENCOUNTER — Other Ambulatory Visit: Payer: Self-pay | Admitting: Internal Medicine

## 2024-07-07 NOTE — Telephone Encounter (Signed)
 Pt was notified.

## 2024-07-14 ENCOUNTER — Other Ambulatory Visit (HOSPITAL_COMMUNITY): Payer: Self-pay

## 2024-07-14 ENCOUNTER — Encounter (HOSPITAL_COMMUNITY): Payer: Self-pay | Admitting: Internal Medicine

## 2024-07-14 ENCOUNTER — Ambulatory Visit (HOSPITAL_COMMUNITY)
Admission: RE | Admit: 2024-07-14 | Discharge: 2024-07-14 | Disposition: A | Discharge status: Home / Self Care | Source: Ambulatory Visit | Attending: Internal Medicine | Admitting: Internal Medicine

## 2024-07-14 VITALS — BP 152/92 | HR 88 | Ht 62.0 in | Wt 114.2 lb

## 2024-07-14 DIAGNOSIS — R55 Syncope and collapse: Secondary | ICD-10-CM

## 2024-07-14 DIAGNOSIS — I480 Paroxysmal atrial fibrillation: Secondary | ICD-10-CM | POA: Diagnosis not present

## 2024-07-14 DIAGNOSIS — D6869 Other thrombophilia: Secondary | ICD-10-CM | POA: Diagnosis not present

## 2024-07-14 MED ORDER — RIVAROXABAN 20 MG PO TABS: 20.0000 mg | ORAL_TABLET | Freq: Every day | ORAL | 11 refills | Status: AC

## 2024-07-14 NOTE — Progress Notes (Signed)
 Primary Care Physician: Bulah Alm RAMAN, PA-C Primary Cardiologist: None Electrophysiologist: None     Referring Physician: ED     Charlotte Williams is a 78 y.o. female with a history of HTN, aortic atherosclerosis by CT imaging, arthritis, syncope, and atrial fibrillation who presents for consultation in the [facility]. ED visit on 05/14/24 for syncope; cardiac monitor placed by PCP showed 20% Afib burden. Patient is on Xarelto  for a CHADS2VASC score of 4.  On evaluation today, patient is currently in NSR. Patient notes to overall be active, drink decaf coffee, and rarely has alcohol. She noted on day of syncope she did not have much to eat and prior to dinner had a margarita. She advises her daughter prior to syncope she felt as though she was going to faint. Patient notes during monitor period she did not have cardiac awareness. She wears an Apple watch. Patient is currently on Xarelto  starter pack; had to switch from Eliquis  due to insurance. She notes she has stopped Mobic  due to OAC and is having a difficult time due to arthritic pains. She would like to take Mobic  again; she notes tylenol does not control her pain.   Today, she denies symptoms of palpitations, chest pain, shortness of breath, orthopnea, PND, lower extremity edema, dizziness, presyncope, syncope, snoring, daytime somnolence, bleeding, or neurologic sequela. The patient is tolerating medications without difficulties and is otherwise without complaint today.    she has a BMI of Body mass index is 20.89 kg/m.[name] Filed Weights   07/14/24 0822  Weight: 51.8 kg    Current Outpatient Medications  Medication Sig Dispense Refill   alendronate  (FOSAMAX ) 70 MG tablet TAKE 1 TABLET (70 MG TOTAL) BY MOUTH EVERY 7 DAYS WITH FULL GLASS WATER ON EMPTY STOMACH 12 tablet 1   Azelastine HCl 137 MCG/SPRAY SOLN Place 2 sprays into both nostrils as needed.     carbamide peroxide (DEBROX) 6.5 % OTIC solution  Place 5 drops into both ears daily as needed. 15 mL 0   hydrOXYzine  (ATARAX ) 10 MG tablet TAKE 1 TABLET (10 MG TOTAL) BY MOUTH IN THE MORNING AND AT BEDTIME 180 tablet 1   latanoprost (XALATAN) 0.005 % ophthalmic solution 1 drop at bedtime.     LORazepam  (ATIVAN ) 0.5 MG tablet TAKE ONE TABLET BY MOUTH AT BEDTIME AS NEEDED FOR ANXIETY OR SLEEP 30 tablet 0   rivaroxaban  (XARELTO ) 20 MG TABS tablet Take 1 tablet (20 mg total) by mouth daily with supper. 30 tablet 11   No current facility-administered medications for this encounter.    Atrial Fibrillation Management history:  Previous antiarrhythmic drugs: none Previous cardioversions: none Previous ablations: none Anticoagulation history: Eliquis , Xarelto    ROS- All systems are reviewed and negative except as per the HPI above.  Physical Exam: BP (!) 152/92   Pulse 88   Ht 5' 2 (1.575 m)   Wt 51.8 kg   BMI 20.89 kg/m   GEN: Well nourished, well developed in no acute distress NECK: No JVD; No carotid bruits CARDIAC: Regular rate and rhythm, no murmurs, rubs, gallops RESPIRATORY:  Clear to auscultation without rales, wheezing or rhonchi  ABDOMEN: Soft, non-tender, non-distended EXTREMITIES:  No edema; No deformity   EKG today demonstrates  Vent. rate 88 BPM PR interval 128 ms QRS duration 82 ms QT/QTcB 356/430 ms P-R-T axes 47 50 62 Normal sinus rhythm Normal ECG When compared with ECG of 11-May-2008 10:57, No significant change was found Confirmed by End, Lonni (  [id number]) on 07/14/2024 10:00:06 AM  Cardiac monitor 05/2024: NSR with lower rate of 63/min and upper rate of 142/min, ave HR 99/min. Sustained atrial fib with a RVR present, with 20% burden, longest episode 24 hours.  With rates from 86 to 239/min., associated with with symptoms. Ave rate in atrial fib was 141/min. Episodes of NS SVT present vs atrial flutter with a RVR at rates up to 261/min  (likely 1:1 flutter) No prolonged pauses 2% PAC's and 1%  PVC's NSVT is present though could also be abherrently conducted aflutter. Symptoms associated with atrial fib.  Patch Wear Time:  13 days and 21 hours (2025-07-07T17:14:54-0400 to 2025-07-21T15:06:31-0400)  ASSESSMENT & PLAN CHA2DS2-VASc Score = 4  The patient's score is based upon: CHF History: 0 HTN History: 1 Diabetes History: 0 Stroke History: 0 Vascular Disease History: 0 Age Score: 2 Gender Score: 1       ASSESSMENT AND PLAN: Paroxysmal Atrial Fibrillation (ICD10:  I48.0) The patient's CHA2DS2-VASc score is 4, indicating a 4.8% annual risk of stroke.    Patient is currently in NSR. Education provided about Afib. Discussion about medication treatments and ablation going forward if indicated due to elevated Afib burden on monitor. After discussion, patient would like to discuss ablation and Watchman procedure with EP. Will order baseline echocardiogram. Will refer to EP to discuss ablation and Watchman. Patient is not interested in AAD therapy and would like to take Mobic  again in the future. Continue to use Apple watch to monitor rhythm at home.   Secondary Hypercoagulable State (ICD10:  D68.69) The patient is at significant risk for stroke/thromboembolism based upon her CHA2DS2-VASc Score of 4.  Continue Rivaroxaban  (Xarelto ).  Continue Xarelto  20 mg daily. Do not take 15 mg dose. CrCl 55 mL/min    Will refer to EP to discuss ablation and Watchman procedure.    Terra Pac, PA-C  [facility] [address] [place], [place] [address] [phone]

## 2024-07-20 NOTE — Progress Notes (Signed)
 " Electrophysiology Office Note:   Date:  07/23/2024  ID:  ZELA SOBIESKI, DOB July 01, 1946, MRN [id number]  Primary Cardiologist: None Electrophysiologist: Fonda Kitty, MD      History of Present Illness:   Charlotte Williams is a 78 y.o. female with h/o HTN, aortic atherosclerosis by CT imaging, arthritis, syncope, and atrial fibrillation who is being seen today for evaluation for catheter ablation and Watchman at the request of Fairy Heinrich, [place].  Discussed the use of AI scribe software for clinical note transcription with the patient, who gave verbal consent to proceed.  History of Present Illness Charlotte Williams is a 78 year old female with atrial fibrillation who presents for evaluation of her condition and management options. She was referred by Dr. Larnell Heinrich from the AFib clinic for further evaluation.  She experiences atrial fibrillation. A notable episode occurred on June 19th when she fainted at dinner after consuming a small meal and a few sips of a margarita. During this episode, she was assisted by a doctor who was present at the restaurant.  She wore a monitor which indicated that she was in AFib 20% of the time. Despite this, she does not feel any severe symptoms typically associated with Afib. She feels healthy overall and does not always notice when she is in AFib.  She is currently on Xarelto  for stroke prevention due to her AFib. She also has arthritis, which is exacerbated by her inability to take certain arthritis medications due to her blood thinner. Her arthritis mainly affects her knees, making it difficult to go up and down stairs. She has noticed this more recently. She also has Raynaud's, which causes her discomfort in cold weather.  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 She is cautious about her movements to avoid falls and believes her neck pain may be from constantly looking down.     Review of systems complete and  found to be negative unless listed in HPI.   EP Information / Studies Reviewed:    EKG is not ordered today. EKG from 07/14/24 reviewed which showed sinus rhythm.      Zio 06/19/24:  NSR with lower rate of 63/min and upper rate of 142/min, ave HR 99/min. Sustained atrial fib with a RVR present, with 20% burden, longest episode 24 hours.  With rates from 86 to 239/min., associated with with symptoms. Ave rate in atrial fib was 141/min. Episodes of NS SVT present vs atrial flutter with a RVR at rates up to 261/min  (likely 1:1 flutter) No prolonged pauses 2% PAC's and 1% PVC's NSVT is present though could also be abherrently conducted aflutter. Symptoms associated with atrial fib.  Patch Wear Time:  13 days and 21 hours (2025-07-07T17:14:54-0400 to 2025-07-21T15:06:31-0400)  Risk Assessment/Calculations:    CHA2DS2-VASc Score = 4   This indicates a 4.8% annual risk of stroke. The patient's score is based upon: CHF History: 0 HTN History: 1 Diabetes History: 0 Stroke History: 0 Vascular Disease History: 0 Age Score: 2 Gender Score: 1         Physical Exam:   VS:  BP 132/78   Pulse 81   Ht 5' 2 (1.575 m)   Wt 114 lb (51.7 kg)   SpO2 98%   BMI 20.85 kg/m    Wt Readings from Last 3 Encounters:  07/21/24 114 lb (51.7 kg)  07/14/24 114 lb 3.2 oz (51.8 kg)  07/02/24 115 lb 6.4 oz (52.3  kg)     GEN: Well nourished, well developed in no acute distress NECK: No JVD CARDIAC: Normal rate, regular rhythm RESPIRATORY:  Clear to auscultation without rales, wheezing or rhonchi  ABDOMEN: Soft, non-distended EXTREMITIES:  No edema; No deformity   ASSESSMENT AND PLAN:    #Paroxysmal atrial fibrillation: Intermittent episodes of atrial fibrillation, occurring 20% of the time based on recent monitoring.  #Atrial flutter:  -Discussed treatment options today for AF including antiarrhythmic drug therapy and ablation. Discussed risks, recovery and likelihood of success with each  treatment strategy. Risk, benefits, and alternatives to EP study and ablation for afib were discussed. These risks include but are not limited to stroke, bleeding, vascular damage, tamponade, perforation, damage to the esophagus, lungs, phrenic nerve and other structures, pulmonary vein stenosis, worsening renal function, coronary vasospasm and death.  Discussed potential need for repeat ablation procedures and antiarrhythmic drugs after an initial ablation. The patient understands these risk and wishes to proceed.  We will therefore proceed with catheter ablation at the next available time.  Carto, ICE, anesthesia are requested for the procedure.   -We will obtain CT PV protocol prior to the procedure.  #Hypertension At goal today.  Recommend checking blood pressures 1-2 times per week at home and recording the values.  Recommend bringing these recordings to the primary care physician.  #Hypercoagulable state due to AF/AFL #Chronic arthritis I have seen Charlotte Williams in the office today who is being considered for a Watchman left atrial appendage closure device. I believe they will benefit from this procedure given their history of atrial fibrillation, CHA2DS2-VASc score of 4 and unadjusted ischemic stroke rate of 4.8% per year. Unfortunately, the patient is not felt to be a long term anticoagulation candidate secondary to interactions with chronic medications. The patient's chart has been reviewed and I feel that they would be a candidate for short term oral anticoagulation after Watchman implant.   It is my belief that after undergoing a LAA closure procedure, Charlotte Williams will not need long term anticoagulation which eliminates anticoagulation side effects and major bleeding risk.   Procedural risks for the Watchman implant have been reviewed with the patient including a 0.5% risk of stroke, <1% risk of perforation and <1% risk of device embolization. Other risks include bleeding, vascular damage,  tamponade, worsening renal function, and death. The patient understands these risk and wishes to proceed.     The published clinical data on the safety and effectiveness of WATCHMAN include but are not limited to the following: - Holmes DR, Jess BEARD, Sick P et al. for the PROTECT AF Investigators. Percutaneous closure of the left atrial appendage versus warfarin therapy for prevention of stroke in patients with atrial fibrillation: a randomised non-inferiority trial. Lancet 2009; 374: 534-42. GLENWOOD Jess BEARD, Doshi SK, Jonita VEAR Satchel D et al. on behalf of the PROTECT AF Investigators. Percutaneous Left Atrial Appendage Closure for Stroke Prophylaxis in Patients With Atrial Fibrillation 2.3-Year Follow-up of the PROTECT AF (Watchman Left Atrial Appendage System for Embolic Protection in Patients With Atrial Fibrillation) Trial. Circulation 2013; 127:720-729. - Alli O, Doshi S,  Kar S, Reddy VY, Sievert H et al. Quality of Life Assessment in the Randomized PROTECT AF (Percutaneous Closure of the Left Atrial Appendage Versus Warfarin Therapy for Prevention of Stroke in Patients With Atrial Fibrillation) Trial of Patients at Risk for Stroke With Nonvalvular Atrial Fibrillation. J Am Coll Cardiol 2013; 61:1790-8. GLENWOOD Satchel DR, Archer RAMAN, Price M, Whisenant B, Sievert H,  Duayne GORMAN Olean MARLA Jess V. Prospective randomized evaluation of the Watchman left atrial appendage Device in patients with atrial fibrillation versus long-term warfarin therapy; the PREVAIL trial. Journal of the [employer] of Cardiology, Vol. 4, No. 1, 2014, 1-11. - Kar S, Doshi SK, Sadhu A, Horton R, Osorio J et al. Primary outcome evaluation of a next-generation left atrial appendage closure device: results from the PINNACLE FLX trial. Circulation 2021;143(18)1754-1762.   HAS-BLED score 3 Hypertension Yes  Abnormal renal and liver function (Dialysis, transplant, Cr >2.26 mg/dL /Cirrhosis or Bilirubin >2x Normal or AST/ALT/AP >3x Normal) No   Stroke No  Bleeding No  Labile INR (Unstable/high INR) No  Elderly (>65) Yes  Drugs or alcohol (>= 8 drinks/week, anti-plt or NSAID) Yes   CHA2DS2-VASc Score = 4  The patient's score is based upon: CHF History: 0 HTN History: 1 Diabetes History: 0 Stroke History: 0 Vascular Disease History: 0 Age Score: 2 Gender Score: 1      After today's visit, the patient decided to proceed with concomitant AF ablation and LAA appendage closure procedure. Prior to the procedure, I would like to obtain a gated CT scan of the chest with contrast timed for PV/LA visualization. She will continue Xarelto  in the interim.   Follow up with Dr. Kennyth 3 months after ablation/Watchman.   Signed, Fonda Kennyth, MD  "

## 2024-07-21 ENCOUNTER — Ambulatory Visit: Attending: Cardiology | Admitting: Cardiology

## 2024-07-21 VITALS — BP 132/78 | HR 81 | Ht 62.0 in | Wt 114.0 lb

## 2024-07-21 DIAGNOSIS — I10 Essential (primary) hypertension: Secondary | ICD-10-CM

## 2024-07-21 DIAGNOSIS — I4892 Unspecified atrial flutter: Secondary | ICD-10-CM

## 2024-07-21 DIAGNOSIS — D6869 Other thrombophilia: Secondary | ICD-10-CM | POA: Diagnosis not present

## 2024-07-21 DIAGNOSIS — I480 Paroxysmal atrial fibrillation: Secondary | ICD-10-CM | POA: Diagnosis not present

## 2024-07-21 DIAGNOSIS — M1990 Unspecified osteoarthritis, unspecified site: Secondary | ICD-10-CM | POA: Diagnosis not present

## 2024-07-21 NOTE — Patient Instructions (Signed)
 Medication Instructions:  Your physician recommends that you continue on your current medications as directed. Please refer to the Current Medication list given to you today.  *If you need a refill on your cardiac medications before your next appointment, please call your pharmacy*  Testing/Procedures: Ablation Your physician has recommended that you have an ablation. Catheter ablation is a medical procedure used to treat some cardiac arrhythmias (irregular heartbeats). During catheter ablation, a long, thin, flexible tube is put into a blood vessel in your groin (upper thigh), or neck. This tube is called an ablation catheter. It is then guided to your heart through the blood vessel. Radio frequency waves destroy small areas of heart tissue where abnormal heartbeats may cause an arrhythmia to start.   Watchman Your physician has requested that you have Left atrial appendage (LAA) closure device implantation is a procedure to put a small device in the LAA of the heart. The LAA is a small sac in the wall of the heart's left upper chamber. Blood clots can form in this area. The device, Watchman closes the LAA to help prevent a blood clot and stroke.    Follow-Up: At [facility], you and your health needs are our priority.  As part of our continuing mission to provide you with exceptional heart care, our providers are all part of one team.  This team includes your primary Cardiologist (physician) and Advanced Practice Providers or APPs (Physician Assistants and Nurse Practitioners) who all work together to provide you with the care you need, when you need it.  Your next appointment:   Please call us  if you decide to schedule at [phone]

## 2024-07-22 ENCOUNTER — Telehealth: Payer: Self-pay | Admitting: Cardiology

## 2024-07-22 DIAGNOSIS — I480 Paroxysmal atrial fibrillation: Secondary | ICD-10-CM

## 2024-07-22 NOTE — Telephone Encounter (Signed)
 Patient stated she wants to move ahead with getting ablation and Watchman and wants a call back to discuss next steps.

## 2024-07-22 NOTE — Therapy (Unsigned)
 OUTPATIENT PHYSICAL THERAPY CERVICAL EVALUATION/ONE TIME   Patient Name: Charlotte Williams MRN: [id number] DOB:1946/02/15, 78 y.o., female Today's Date: 07/24/2024  END OF SESSION:  PT End of Session - 07/24/24 0738     Visit Number 1    Number of Visits 1    Date for PT Re-Evaluation 07/23/24    Authorization Type [employer] MCR    PT Start Time 1145    PT Stop Time 1230    PT Time Calculation (min) 45 min    Activity Tolerance Patient tolerated treatment well    Behavior During Therapy [facility] for tasks assessed/performed          Past Medical History:  Diagnosis Date   Adenomatous colon polyp    Allergy    Aortic atherosclerosis (HCC) 2023   Per CT abdomen pelvis   Arthritis    Diverticulosis    GERD (gastroesophageal reflux disease)    Hypertension    Internal hemorrhoids    Osteopenia    Past Surgical History:  Procedure Laterality Date   APPENDECTOMY     ROTATOR CUFF REPAIR Right    Bicept repair   TONSILLECTOMY AND ADENOIDECTOMY     Patient Active Problem List   Diagnosis Date Noted   Aortic atherosclerosis (HCC) 02/19/2024   Essential hypertension, benign 02/19/2024   Vaccine counseling 11/28/2023   Screening for hematuria or proteinuria 11/28/2023   Itching 11/28/2023   Abnormal food appetite 11/28/2023   Weight loss 11/28/2023   Postmenopausal estrogen deficiency 05/27/2023   Age-related osteoporosis without current pathological fracture 05/27/2023   Chronic abdominal pain 05/27/2023   Congestion of upper airway 11/14/2022   Encounter for health maintenance examination in adult 11/14/2022   Hyperlipidemia 12/30/2017   BMI 20.0-20.9, adult 12/30/2017   Anxiety 12/30/2017   Elevated rheumatoid factor 10/27/2017   Raynaud's phenomenon 10/27/2017   History of diverticulitis 10/27/2017   Vitamin D  insufficiency 10/27/2017   Osteoarthritis of both hands 10/27/2017   Chronic low back pain 10/27/2017   Colicky RUQ abdominal pain 08/20/2017   Insomnia  08/20/2017    PCP: Bulah Alm RAMAN, PA-C  REFERRING PROVIDER: Bulah Alm RAMAN, PA-C  REFERRING DIAG: M54.2 (ICD-10-CM) - Neck pain M62.838 (ICD-10-CM) - Neck muscle spasm  THERAPY DIAG:  Cervicalgia  Rationale for Evaluation and Treatment: Rehabilitation  ONSET DATE: chronic   SUBJECTIVE:  SUBJECTIVE STATEMENT: Pt here with a history of neck pain and stiffness.  She brought it to her doctors attention and he recommend  PT.  Her neck pain is better now.  She still has some stiffness with rotation.  She exercises and does stretch her neck.  She would like to get recommendations further and continue at home. She will be getting an ablation with a watchman to prevent blood clots.    Hand dominance: Right  PERTINENT HISTORY:  Rt RCR , OA , A fib   PAIN:  Are you having pain? Yes: NPRS scale: stiffness, min  Pain location: neck bilateral and travels down the Rt arm Pain description: tight, tense  Aggravating factors: looking down a lot due to balance, phone  Relieving factors: time did help , meloxicam    PRECAUTIONS: Other: A fib   RED FLAGS: None     WEIGHT BEARING RESTRICTIONS: No  FALLS:  Has patient fallen in last 6 months? No  LIVING ENVIRONMENT: Lives with: lives with their spouse Lives in: House/apartment Stairs: no issues  Has following equipment at home: None  OCCUPATION: worked in schools, pre-K and special Ed.   PLOF: Independent  PATIENT GOALS: Pain mgmt   NEXT MD VISIT: asn needed   OBJECTIVE:  Note: Objective measures were completed at Evaluation unless otherwise noted.  DIAGNOSTIC FINDINGS:  None recent   PATIENT SURVEYS:  NDI: 8/50 (16%)  Minimum Detectable Change (90% confidence): 5 points or 10% points  COGNITION: Overall cognitive  status: Within functional limits for tasks assessed  SENSATION: WFL  POSTURE: forward head  PALPATION: Tender points and knots along bilateral upper traps, posterior cervicals    CERVICAL ROM:   Active ROM A/PROM (deg) eval  Flexion 62  Extension 42  Right lateral flexion 28  Left lateral flexion 20  Right rotation 40  Left rotation 35   (Blank rows = not tested)  UPPER EXTREMITY ROM: WFL other than Rt UE behind back   Active ROM Right eval Left eval  Shoulder flexion    Shoulder extension    Shoulder abduction    Shoulder adduction    Shoulder extension    Shoulder internal rotation    Shoulder external rotation    Elbow flexion    Elbow extension    Wrist flexion    Wrist extension    Wrist ulnar deviation    Wrist radial deviation    Wrist pronation    Wrist supination     (Blank rows = not tested)  UPPER EXTREMITY MMT:  MMT Right eval Left eval  Shoulder flexion 3+ 4-  Shoulder extension    Shoulder abduction 3+ 4-  Shoulder adduction    Shoulder extension    Shoulder internal rotation    Shoulder external rotation     CERVICAL SPECIAL TESTS:  NT   FUNCTIONAL TESTS:  5 times sit to stand: NT   TREATMENT DATE:   [facility] Adult PT Treatment:                                                DATE: 07/23/24 Self Care: Instructed in HEP for C spine Posture Importance of strengthening Recommended massage Option to cont. here at clinic or do at home  PATIENT EDUCATION:  Education details: self care  Person educated: Patient Education method: Explanation, Demonstration, Verbal cues, and Handouts Education comprehension: verbalized understanding  HOME EXERCISE PROGRAM: Access Code: [email or web address] URL: [email or web address] Date: 07/24/2024 Prepared by: Delon Norma  Exercises - Supine Cervical Retraction with Towel   - 1 x daily - 7 x weekly - 2 sets - 10 reps - 5 hold - Seated Assisted Cervical Rotation with Towel  - 1 x daily - 7 x weekly - 1 sets - 3 reps - 30 hold - Seated Upper Trap Stretch  - 1 x daily - 7 x weekly - 1 sets - 3 reps - 30 hold - Seated Scapular Retraction  - 1 x daily - 7 x weekly - 2 sets - 10 reps - 5 hold  ASSESSMENT:  CLINICAL IMPRESSION: Patient is a 78 y.o. female who was seen today for physical therapy evaluation and treatment for neck pain.   OBJECTIVE IMPAIRMENTS: increased fascial restrictions, impaired flexibility, and postural dysfunction.   ACTIVITY LIMITATIONS: not currently limited  PARTICIPATION LIMITATIONS: not limited   PERSONAL FACTORS: 1-2 comorbidities: a fib and osteopenia are also affecting patient's functional outcome.   REHAB POTENTIAL: Excellent  CLINICAL DECISION MAKING: Stable/uncomplicated  EVALUATION COMPLEXITY: Low   GOALS: Goals reviewed with patient? Yes  SHORT TERM GOALS=LONG TERM GOALS Target date: 07/24/24    Pt will be given a safe home program for cervical stretching  Baseline: given Goal status: MET     PLAN:  PT FREQUENCY: one time visit  PT DURATION: 1 sessions  PLANNED INTERVENTIONS: 97110-Therapeutic exercises, 97530- Therapeutic activity, [id number]- Neuromuscular re-education, 97535- Self Care, and [id number]- Manual therapy  PLAN FOR NEXT SESSION: DC    Ronnel Zuercher, PT 07/24/2024, 7:39 AM   Delon Norma, PT 07/24/24 7:57 AM Phone: [phone] Fax: [phone]

## 2024-07-23 ENCOUNTER — Encounter: Payer: Self-pay | Admitting: Physical Therapy

## 2024-07-23 ENCOUNTER — Ambulatory Visit: Attending: Medical | Admitting: Physical Therapy

## 2024-07-23 DIAGNOSIS — M62838 Other muscle spasm: Secondary | ICD-10-CM | POA: Diagnosis not present

## 2024-07-23 DIAGNOSIS — M542 Cervicalgia: Secondary | ICD-10-CM | POA: Insufficient documentation

## 2024-07-23 NOTE — Telephone Encounter (Signed)
 Scheduled the patient for cCT 9/15 when she is coming to [facility] for her echocardiogram. Will plan for concomitant ablation/LAAO on 11/02/2024. Will schedule pre-procedure visit internal jugular Delight when she is called with echo results. She was grateful for call and agreed with plan.

## 2024-07-28 ENCOUNTER — Other Ambulatory Visit: Payer: Self-pay | Admitting: Medical

## 2024-07-28 NOTE — Telephone Encounter (Signed)
 Per last ov note. Pt is to discontinue anti-inflammatories

## 2024-08-03 DIAGNOSIS — H40023 Open angle with borderline findings, high risk, bilateral: Secondary | ICD-10-CM | POA: Diagnosis not present

## 2024-08-03 DIAGNOSIS — H10413 Chronic giant papillary conjunctivitis, bilateral: Secondary | ICD-10-CM | POA: Diagnosis not present

## 2024-08-03 DIAGNOSIS — H02132 Senile ectropion of right lower eyelid: Secondary | ICD-10-CM | POA: Diagnosis not present

## 2024-08-03 DIAGNOSIS — H2513 Age-related nuclear cataract, bilateral: Secondary | ICD-10-CM | POA: Diagnosis not present

## 2024-08-03 DIAGNOSIS — H02135 Senile ectropion of left lower eyelid: Secondary | ICD-10-CM | POA: Diagnosis not present

## 2024-08-03 DIAGNOSIS — H04123 Dry eye syndrome of bilateral lacrimal glands: Secondary | ICD-10-CM | POA: Diagnosis not present

## 2024-08-04 ENCOUNTER — Institutional Professional Consult (permissible substitution): Admitting: Cardiology

## 2024-08-06 ENCOUNTER — Emergency Department (HOSPITAL_COMMUNITY)

## 2024-08-06 ENCOUNTER — Emergency Department (HOSPITAL_COMMUNITY)
Admission: EM | Admit: 2024-08-06 | Discharge: 2024-08-06 | Disposition: A | Discharge status: Home / Self Care | Attending: Emergency Medicine | Admitting: Emergency Medicine

## 2024-08-06 ENCOUNTER — Telehealth: Payer: Self-pay

## 2024-08-06 ENCOUNTER — Other Ambulatory Visit: Payer: Self-pay

## 2024-08-06 ENCOUNTER — Ambulatory Visit: Payer: Self-pay | Admitting: Medical

## 2024-08-06 ENCOUNTER — Telehealth: Payer: Self-pay | Admitting: Cardiology

## 2024-08-06 ENCOUNTER — Ambulatory Visit: Admitting: Cardiology

## 2024-08-06 DIAGNOSIS — Z79899 Other long term (current) drug therapy: Secondary | ICD-10-CM | POA: Diagnosis not present

## 2024-08-06 DIAGNOSIS — I4891 Unspecified atrial fibrillation: Secondary | ICD-10-CM | POA: Insufficient documentation

## 2024-08-06 DIAGNOSIS — R110 Nausea: Secondary | ICD-10-CM | POA: Diagnosis not present

## 2024-08-06 DIAGNOSIS — R0602 Shortness of breath: Secondary | ICD-10-CM | POA: Insufficient documentation

## 2024-08-06 DIAGNOSIS — R079 Chest pain, unspecified: Secondary | ICD-10-CM | POA: Diagnosis not present

## 2024-08-06 DIAGNOSIS — I4892 Unspecified atrial flutter: Secondary | ICD-10-CM | POA: Diagnosis not present

## 2024-08-06 DIAGNOSIS — R002 Palpitations: Secondary | ICD-10-CM | POA: Diagnosis present

## 2024-08-06 DIAGNOSIS — Z7901 Long term (current) use of anticoagulants: Secondary | ICD-10-CM | POA: Diagnosis not present

## 2024-08-06 LAB — CBC
HCT: 36.9 % (ref 36.0–46.0)
Hemoglobin: 12.6 g/dL (ref 12.0–15.0)
MCH: 31 pg (ref 26.0–34.0)
MCHC: 34.1 g/dL (ref 30.0–36.0)
MCV: 90.7 fL (ref 80.0–100.0)
Platelets: 287 K/uL (ref 150–400)
RBC: 4.07 MIL/uL (ref 3.87–5.11)
RDW: 13 % (ref 11.5–15.5)
WBC: 9.9 K/uL (ref 4.0–10.5)
nRBC: 0 % (ref 0.0–0.2)

## 2024-08-06 LAB — BASIC METABOLIC PANEL WITH GFR
Anion gap: 9 (ref 5–15)
BUN: 10 mg/dL (ref 8–23)
CO2: 25 mmol/L (ref 22–32)
Calcium: 8.6 mg/dL — ABNORMAL LOW (ref 8.9–10.3)
Chloride: 102 mmol/L (ref 98–111)
Creatinine, Ser: 0.6 mg/dL (ref 0.44–1.00)
GFR, Estimated: >60 mL/min (ref >60)
Glucose, Bld: 90 mg/dL (ref 70–99)
Potassium: 3.9 mmol/L (ref 3.5–5.1)
Sodium: 136 mmol/L (ref 135–145)

## 2024-08-06 LAB — I-STAT CHEM 8, ED
BUN: 12 mg/dL (ref 8–23)
Calcium, Ion: 1.06 mmol/L — ABNORMAL LOW (ref 1.15–1.40)
Chloride: 101 mmol/L (ref 98–111)
Creatinine, Ser: 0.7 mg/dL (ref 0.44–1.00)
Glucose, Bld: 91 mg/dL (ref 70–99)
HCT: 37 % (ref 36.0–46.0)
Hemoglobin: 12.6 g/dL (ref 12.0–15.0)
Potassium: 3.8 mmol/L (ref 3.5–5.1)
Sodium: 137 mmol/L (ref 135–145)
TCO2: 25 mmol/L (ref 22–32)

## 2024-08-06 LAB — BRAIN NATRIURETIC PEPTIDE: B Natriuretic Peptide: 424.3 pg/mL — ABNORMAL HIGH (ref 0.0–100.0)

## 2024-08-06 MED ORDER — DILTIAZEM HCL 30 MG PO TABS
30.0000 mg | ORAL_TABLET | Freq: Once | ORAL | Status: AC
  Administered 2024-08-06: 30 mg via ORAL
  Filled 2024-08-06 (×2): qty 1

## 2024-08-06 MED ORDER — METOPROLOL SUCCINATE ER 25 MG PO TB24: 25.0000 mg | ORAL_TABLET | Freq: Every day | ORAL | 0 refills | Status: DC

## 2024-08-06 MED ORDER — METOPROLOL TARTRATE 25 MG PO TABS: 25.0000 mg | ORAL_TABLET | Freq: Two times a day (BID) | ORAL | 0 refills | Status: DC | PRN

## 2024-08-06 NOTE — Telephone Encounter (Signed)
 FYI Only or Action Required?: Action required by provider: update on patient condition.  Patient was last seen in primary care on 07/02/2024 by Charlotte Alm RAMAN, PA-C.  Called Nurse Triage reporting Tachycardia.  Symptoms began yesterday.  Triage Disposition: Go to ED Now (Notify PCP)  Patient/caregiver understands and will follow disposition?: No, refuses disposition                 Copied from CRM [id number]. Topic: Clinical - Red Word Triage >> Aug 06, 2024  8:38 AM [place] MATSU wrote: Red Word that prompted transfer to Nurse Triage:  heart rate is high 160 and patient is unsure what to do and its been up all night,. Reason for Disposition  [1] Heart beating very rapidly (e.g., > 140 / minute) AND [2] present now  (Exception: During exercise.)  Answer Assessment - Initial Assessment Questions This RN recommends pt goes to ED but pt refused. This RN notified [facility] of pt refusal of ED disposition.    DESCRIPTION: Please describe your heart rate or heartbeat that you are having (e.g., fast/slow, regular/irregular, skipped or extra beats, palpitations)     HR elevated last night in 150s; pt states she thinks she is in a fib; pt states her chest is hurting (1-2/10 tight feeling); pt has an ablation and watchman on 12/8; currently 150 and goes to 76 and went back up to 150; pt states the last time this happened she passed out  ONSET: When did it start? (e.g., minutes, hours, days)      Last night  Pt states this has not happened before with the pain.  OTHER SYMPTOMS: Do you have any other symptoms? (e.g., dizziness, chest pain, sweating, difficulty breathing)       Denies difficulty breathing or dizziness; pt states she is a little sweaty  Protocols used: Heart Rate and Heartbeat Questions-A-AH

## 2024-08-06 NOTE — ED Provider Notes (Signed)
 [place] EMERGENCY DEPARTMENT AT [facility] Provider Note   CSN: [id number] Arrival date & time: 08/06/24  1119     Patient presents with: No chief complaint on file.   Charlotte Williams is a 78 y.o. female with a history of paroxysmal A-fib on Xarelto  presented to the ED with complaint of palpitations and chest soreness.  This began yesterday evening.  It persisted all night.  She said her Apple Watch told her heart rate was running fast.  She has been compliant with her home medications including her Xarelto , which she has been on for about 5 weeks now.  She sees cardiology and has a scheduled ablation and watchman's procedure in December.  She also was in A-fib clinic scheduled early next week, for coronary CT imaging.  She denies any recent fevers, chills, cough, congestion, sick symptoms.  Denies any heavy alcohol binges.  She was diagnosed recently, this year with new onset atrial fibrillation.  She has been on Xarelto  since then.  She has not had an echocardiogram performed yet.  She wore Holter monitors found to be in A-fib proximately 20% of the time.  She notes that she cannot tell when she goes into A-fib but, until last night, but that the chest pressure and palpitations feel unusual, with the shortness of breath.  She denies any known coronary history.   HPI     Prior to Admission medications   Medication Sig Start Date End Date Taking? Authorizing Provider  acetaminophen (TYLENOL) 650 MG suppository Place 1,300 mg rectally every 4 (four) hours as needed for moderate pain (pain score 4-6).   Yes [provider]  alendronate  (FOSAMAX ) 70 MG tablet TAKE 1 TABLET (70 MG TOTAL) BY MOUTH EVERY 7 DAYS WITH FULL GLASS WATER ON EMPTY STOMACH 03/16/24  Yes Tysinger, Alm RAMAN, PA-C  Azelastine HCl 137 MCG/SPRAY SOLN Place 2 sprays into both nostrils as needed (Rhinitis). 02/22/23  Yes [provider]  hydrOXYzine  (ATARAX ) 10 MG tablet TAKE 1 TABLET (10 MG TOTAL)  BY MOUTH IN THE MORNING AND AT BEDTIME Patient taking differently: Take 10 mg by mouth 2 (two) times daily as needed for itching. 03/18/24  Yes Tysinger, Alm RAMAN, PA-C  latanoprost (XALATAN) 0.005 % ophthalmic solution Place 1 drop into both eyes at bedtime. 10/01/22  Yes [provider]  LORazepam  (ATIVAN ) 0.5 MG tablet TAKE ONE TABLET BY MOUTH AT BEDTIME AS NEEDED FOR ANXIETY OR SLEEP 05/08/24  Yes Early, Sara E, NP  rivaroxaban  (XARELTO ) 20 MG TABS tablet Take 1 tablet (20 mg total) by mouth daily with supper. 07/14/24  Yes Terra Fairy PARAS, PA-C  rosuvastatin  (CRESTOR ) 5 MG tablet Take 5 mg by mouth every other day.   Yes [provider]  metoprolol  succinate (TOPROL -XL) 25 MG 24 hr tablet Take 1 tablet (25 mg total) by mouth daily. 08/06/24 09/05/24 Yes Hoa Deriso, Donnice PARAS, MD  metoprolol  tartrate (LOPRESSOR ) 25 MG tablet Take 1 tablet (25 mg total) by mouth 2 (two) times daily as needed. For persistent heart rate above 120 beats per minute at rest 08/06/24 09/05/24 Yes Jannifer Fischler, Donnice PARAS, MD    Allergies: Codeine and Sulfa antibiotics    Review of Systems  Updated Vital Signs BP (!) 107/51   Pulse 95   Temp 98 F (36.7 C) (Oral)   Resp 18   Ht 5' 2 (1.575 m)   Wt 51.7 kg   SpO2 100%   BMI 20.85 kg/m   Physical Exam Constitutional:  General: She is not in acute distress. HENT:     Head: Normocephalic and atraumatic.  Eyes:     Conjunctiva/sclera: Conjunctivae normal.     Pupils: Pupils are equal, round, and reactive to light.  Cardiovascular:     Rate and Rhythm: Tachycardia present. Rhythm irregular.  Pulmonary:     Effort: Pulmonary effort is normal. No respiratory distress.     Breath sounds: Normal breath sounds.  Abdominal:     General: There is no distension.     Tenderness: There is no abdominal tenderness.  Skin:    General: Skin is warm and dry.  Neurological:     General: No focal deficit present.     Mental Status: She is alert. Mental  status is at baseline.  Psychiatric:        Mood and Affect: Mood normal.        Behavior: Behavior normal.     (all labs ordered are listed, but only abnormal results are displayed) Labs Reviewed  BRAIN NATRIURETIC PEPTIDE - Abnormal; Notable for the following components:      Result Value   B Natriuretic Peptide 424.3 (*)    All other components within normal limits  BASIC METABOLIC PANEL WITH GFR - Abnormal; Notable for the following components:   Calcium  8.6 (*)    All other components within normal limits  I-STAT CHEM 8, ED - Abnormal; Notable for the following components:   Calcium , Ion 1.06 (*)    All other components within normal limits  CBC    EKG: EKG Interpretation Date/Time:  Thursday August 06 2024 12:16:10 EDT Ventricular Rate:  93 PR Interval:  118 QRS Duration:  95 QT Interval:  359 QTC Calculation: 447 R Axis:   61  Text Interpretation: Sinus rhythm Atrial premature complex Borderline short PR interval Abnormal R-wave progression, early transition Confirmed by Cottie Cough [phone]) on 08/06/2024 12:23:05 PM  Radiology: [facility] Chest 2 View Result Date: 08/06/2024 CLINICAL DATA:  Chest pain. EXAM: CHEST - 2 VIEW COMPARISON:  June 22, 2009 FINDINGS: The heart size and mediastinal contours are within normal limits. Both lungs are clear. The visualized skeletal structures are unremarkable. IMPRESSION: No active cardiopulmonary disease. Electronically Signed   By: Suzen Dials M.D.   On: 08/06/2024 12:12     Procedures   Medications Ordered in the ED  diltiazem  (CARDIZEM ) tablet 30 mg (30 mg Oral Given 08/06/24 1248)    Clinical Course as of 08/06/24 1425  Thu Aug 06, 2024  1146 While I was talking to the patient she spontaneously converted back into a sinus rhythm [MT]  1421 Patient remains asymptomatic in the sinus rhythm.  I think she is stable for discharge.  I will start her on a low-dose metoprolol  extended release until she can follow up for her  imaging Monday.  BNP is elevated but she has no tachypnea, hypoxia, or evidence of congestion on her x-ray or pulmonary exam to warrant diuresis at this time. [MT]  1425 BP 122/65 mmhg [MT]    Clinical Course User Index [MT] Kiondre Grenz, Cough PARAS, MD                                 Medical Decision Making Amount and/or Complexity of Data Reviewed Labs: ordered. Radiology: ordered. ECG/medicine tests: ordered.  Risk Prescription drug management.   This patient presents to the ED with concern for palpitations, shortness of breath. This involves  an extensive number of treatment options, and is a complaint that carries with it a high risk of complications and morbidity.  The differential diagnosis includes with RVR versus congestive heart failure versus pleural effusion versus anemia versus pneumonia versus pneumothorax versus other  Co-morbidities that complicate the patient evaluation: History of arrhythmia at high risk of recurrence   External records from outside source obtained and reviewed including cardiology office records noting CHADSVASC 2 score of 4.  Plan for ablation and echocardiogram in December  I ordered and personally interpreted labs.  The pertinent results include: No emergent findings  I ordered imaging studies including dg chest I independently visualized and interpreted imaging which showed no emergent findings I agree with the radiologist interpretation  The patient was maintained on a cardiac monitor.  I personally viewed and interpreted the cardiac monitored which showed an underlying rhythm of: Initially A-fib with RVR, then sinus rhythm  Per my interpretation the patient's ECG shows initial a fib rvr then NSR on repeat  I ordered medication including oral diltiazem  for rate control maintenance  I have reviewed the patients home medicines and have made adjustments as needed  Test Considered: doubt acute PE; no indicaton for CT angiogram, doubt aortic  dissection  After the interventions noted above, I reevaluated the patient and found that they have: improved  Patient initially arrives in A Fib RVR and then spontaneously converted herself into NSR while we were talking. She was immediately asymptomatic afterwards - which lowers my suspicion for ACS, PE, PNA, or other life threatening complication.  We'll continue her on xarelto  for now, start metoprolol  XL 25 mg low dose as a daily maintenance medication until she can be seen in the A Fib clinic early next week.  Disposition:  After consideration of the diagnostic results and the patient's response to treatment, I feel that the patient would benefit from outpatient follow-up.      Final diagnoses:  Atrial fib/flutter, transient [facility])    ED Discharge Orders          Ordered    metoprolol  succinate (TOPROL -XL) 25 MG 24 hr tablet  Daily        08/06/24 1424    metoprolol  tartrate (LOPRESSOR ) 25 MG tablet  2 times daily PRN        08/06/24 1424               Cottie Donnice PARAS, MD 08/06/24 1425

## 2024-08-06 NOTE — ED Notes (Signed)
 CCMD called to add pt to monitor services.

## 2024-08-06 NOTE — ED Triage Notes (Signed)
 According to [facility]: Pt was at home today, she felt her heart racing. She was recent diagnosed with Afib, first attack was June 13 diagnosed in July. Pulse on arrival was 160. Pt was given 10 mg of Cardizem  iv. Heart rate fell to 91.  Vitals 134/75  Hr 131 99% spo2 on 2 l this is not her baseline. 187 cbg

## 2024-08-06 NOTE — Telephone Encounter (Signed)
 Patient c/o Palpitations: STAT if patient c/o lightheadedness, shortness of breath, or chest pain  How long have you had palpitations/irregular HR/ Afib? Are you having the symptoms now? Been 156/162; since before she went to bed. She woke up several times during the night and it would be in the 150/160s  Are you currently experiencing lightheadedness, SOB or CP?  Has chest pain, no lightheadedness or sob. States she is sweaty, but not much   Do you have a history of afib (atrial fibrillation) or irregular heart rhythm?  Just discovered 6/19, but overall a picture of health   Have you checked your BP or HR? (document readings if available):   Are you experiencing any other symptoms?   No   Patient states has never felt af before. HR has been 156/162 all night long. Patient states her ablation is scheduled for Dec but don't see anything scheduled; she is wondering if she can get it down? She states this is the longest its been high. She is trying to drink alot of water. Patient states she doesn't want to go to er, not sure what they would do.    Call sent to triage.

## 2024-08-06 NOTE — Telephone Encounter (Signed)
 Copied from CRM [id number]. Topic: Clinical - Red Word Triage >> Aug 06, 2024  8:38 AM [place] MATSU wrote: Red Word that prompted transfer to Nurse Triage:  heart rate is high 160 and patient is unsure what to do and its been up all night,. >> Aug 06, 2024 10:43 AM Graeme ORN wrote: Patient called. States she received a call to come into office today. Will not be able to come in due to currently with EMS on the way to [facility] ED. Thank You

## 2024-08-06 NOTE — ED Notes (Signed)
 Pt is leaving for home with friend. In no new onset distress at this time. Paper work reviewed with pt. No questions or concerns at this time.

## 2024-08-06 NOTE — Telephone Encounter (Signed)
 Patient reports HR in the 150's since last night and 157 during call. BP 140/113 while on the phone.  Patient reports chest tightness, states she cannot take a deep breath in.   Advised patient to go to ED for evaluation/treatment. Patient declined and requested appt. Discussed with patient why she needs ED evaluation, tests/labs and medications needed that we cannot offer in the office. Symptoms have been present since last night and continue this morning.   Patient verbalized understanding and states she will go to ED.

## 2024-08-06 NOTE — Discharge Instructions (Addendum)
 To control your atrial fibrillation rapid heart rate, I started you on a daily maintenance medicine called metoprolol  succinate (Toprol  XL), which is a once daily, extended release tablet.  You can take this in the morning with breakfast.  I also prescribed you a second medicine as a rescue medicine.  This is called metoprolol  tartrate, or Lopressor .  If you have persistent heart rate above 120 bpm while at rest, you can take one of these tablets every 12 hours.  You should also contact your cardiology office if this is happening.

## 2024-08-06 NOTE — Telephone Encounter (Signed)
 Called and left message for pt to see if she could come in at 10:15am today or 11:45am. Held spot for her incase she calls back

## 2024-08-10 ENCOUNTER — Ambulatory Visit (HOSPITAL_BASED_OUTPATIENT_CLINIC_OR_DEPARTMENT_OTHER)
Admission: RE | Admit: 2024-08-10 | Discharge: 2024-08-10 | Disposition: A | Discharge status: Home / Self Care | Source: Ambulatory Visit | Attending: Medical | Admitting: Medical

## 2024-08-10 ENCOUNTER — Ambulatory Visit (HOSPITAL_COMMUNITY)
Admission: RE | Admit: 2024-08-10 | Discharge: 2024-08-10 | Disposition: A | Discharge status: Home / Self Care | Source: Ambulatory Visit | Attending: Internal Medicine | Admitting: Internal Medicine

## 2024-08-10 DIAGNOSIS — R55 Syncope and collapse: Secondary | ICD-10-CM | POA: Insufficient documentation

## 2024-08-10 DIAGNOSIS — I480 Paroxysmal atrial fibrillation: Secondary | ICD-10-CM

## 2024-08-10 LAB — ECHOCARDIOGRAM COMPLETE
AR max vel: 3.12 cm2
AV Area VTI: 3.06 cm2
AV Area mean vel: 3.18 cm2
AV Mean grad: 1 mmHg
AV Peak grad: 2.3 mmHg
Ao pk vel: 0.75 m/s
Area-P 1/2: 2.91 cm2
MV M vel: 4.92 m/s
MV Peak grad: 96.8 mmHg
S' Lateral: 3.53 cm

## 2024-08-10 MED ORDER — IOHEXOL 350 MG/ML SOLN
100.0000 mL | Freq: Once | INTRAVENOUS | Status: AC | PRN
  Administered 2024-08-10: 80 mL via INTRAVENOUS

## 2024-08-11 ENCOUNTER — Ambulatory Visit (HOSPITAL_COMMUNITY): Payer: Self-pay | Admitting: Internal Medicine

## 2024-08-11 NOTE — Telephone Encounter (Signed)
 Patient is currently taking Toprol  XL 25 mg daily in the mornings, and she has Lopressor  25 mg prescribed to take PRN for elevate HR >120. Patient states she has not had to take PRN Lopressor  so far.  She reports feeling more fatigued since taking Toprol  XL. She states she has been very busy the past 5 days, but she is usually the [employer].  She reports her BP and HR have all been WNL for her. Denies any SOB or chest pain/tightness. Informed patient is sounds like the medication is working well for her and to continue monitoring BP/HR.  Patient would like to know if Dr. Kennyth wants her to continue medication as currently prescribed or if he recommends any changes. Will forward to Dr. Kennyth to review.

## 2024-08-11 NOTE — Telephone Encounter (Signed)
 Patient states while she was at the hospital, Dr. Cottie gave her metoprolol  succinate (TOPROL -XL) 25 MG 24 hr tablet & metoprolol  tartrate (LOPRESSOR ) 25 MG tablet. She would like to know if Dr. Kennyth wants to keep her on these medications or if he wants to change anything. She states since being on the medication, she has been more tired but her HR has been normal.

## 2024-08-12 ENCOUNTER — Ambulatory Visit: Payer: Self-pay | Admitting: Cardiology

## 2024-08-12 MED ORDER — METOPROLOL SUCCINATE ER 25 MG PO TB24: 25.0000 mg | ORAL_TABLET | Freq: Every day | ORAL | 3 refills | Status: AC

## 2024-08-12 MED ORDER — METOPROLOL TARTRATE 25 MG PO TABS: 25.0000 mg | ORAL_TABLET | Freq: Two times a day (BID) | ORAL | 3 refills | Status: DC | PRN

## 2024-08-12 NOTE — Addendum Note (Signed)
 Addended by: CHAUVIGNE, Macayla Ekdahl on: 08/12/2024 04:12 PM   Modules accepted: Orders

## 2024-08-25 ENCOUNTER — Other Ambulatory Visit: Payer: Self-pay | Admitting: Cardiology

## 2024-08-26 ENCOUNTER — Telehealth: Payer: Self-pay | Admitting: Cardiology

## 2024-08-26 NOTE — Telephone Encounter (Signed)
 Patient c/o Palpitations: STAT if patient c/o lightheadedness, shortness of breath, or chest pain  How long have you had palpitations/irregular HR/ Afib? Are you having the symptoms now? Yes  Are you currently experiencing lightheadedness, SOB or CP? No cp, no lightheadedness. But states she is a little sweaty.   Do you have a history of afib (atrial fibrillation) or irregular heart rhythm? Yes  Have you checked your BP or HR? (document readings if available): HR 157  Are you experiencing any other symptoms? No    Patient states she has taken her regular metropolol maybe 15 mins ago. Her HR is 157, she states it was high during the night. Wants to know if she can take a tartrate as she doesn't want to take too much. Patient states she is fixing to leave for [facility] as her daughter is getting married. Ablation planned for 12/31.

## 2024-08-26 NOTE — Telephone Encounter (Signed)
 Spoke with pt. Pt states her HR jumped to 157 over night.  She took a lopressor  at that time. Pt states her daughter is getting married this weekend, she hasn't been sleeping and she had to pull her cat down a few days ago. She states she has been very stressed. Took daily toprol  xl before first call. Pt wanted to know if she should take another one.Bp-115/79  HR- 92 on call. Told pt to hold on taking another one as things seem to be coming down and to recheck in an hour. Reminded pt [place] the beach and that she could take up to 2 lopressors daily if needed for HR over 120. Pt stated understanding.

## 2024-08-27 ENCOUNTER — Other Ambulatory Visit: Payer: Self-pay | Admitting: Medical

## 2024-08-27 NOTE — Telephone Encounter (Signed)
This was already prescribed

## 2024-09-03 ENCOUNTER — Other Ambulatory Visit: Payer: Self-pay | Admitting: Medical

## 2024-09-03 DIAGNOSIS — G4700 Insomnia, unspecified: Secondary | ICD-10-CM

## 2024-09-03 MED ORDER — LORAZEPAM 0.5 MG PO TABS
ORAL_TABLET | ORAL | 0 refills | Status: AC
Start: 2024-09-03 — End: ?

## 2024-09-08 DIAGNOSIS — H1131 Conjunctival hemorrhage, right eye: Secondary | ICD-10-CM | POA: Diagnosis not present

## 2024-09-09 NOTE — Telephone Encounter (Signed)
 Left message for patient to call back

## 2024-09-19 ENCOUNTER — Other Ambulatory Visit: Payer: Self-pay | Admitting: Medical

## 2024-10-06 ENCOUNTER — Telehealth (HOSPITAL_COMMUNITY): Payer: Self-pay

## 2024-10-06 ENCOUNTER — Encounter (HOSPITAL_COMMUNITY): Payer: Self-pay

## 2024-10-06 ENCOUNTER — Other Ambulatory Visit (HOSPITAL_COMMUNITY): Payer: Self-pay

## 2024-10-06 DIAGNOSIS — I480 Paroxysmal atrial fibrillation: Secondary | ICD-10-CM

## 2024-10-06 DIAGNOSIS — Z01812 Encounter for preprocedural laboratory examination: Secondary | ICD-10-CM

## 2024-10-06 NOTE — Telephone Encounter (Signed)
 Spoke with patient to complete pre-procedure call.     Health status review:  Any new medical conditions, recent signs of acute illness or been started on antibiotics? No Any recent hospitalizations or surgeries? No Any new medications started since pre-op visit? No  Follow all medication instructions prior to procedure or the procedure may be rescheduled:    Continue taking Xarelto  (Rivaroxaban ) once daily without missing any doses before procedure. Essential chronic medications:  No medication should be continued, unless told otherwise. On the morning of your procedure DO NOT take any medication., including Xarelto  (Rivaroxaban ).  Nothing to eat or drink after midnight prior to your procedure.  Pre-procedure testing scheduled: CT completed and lab work by November 26.  Confirmed patient is scheduled for Atrial Fibrillation Ablation on Wednesday, December 3 with Dr. Kennyth. Instructed patient to arrive at the Main Entrance A at [facility]: [address] [place], [place] [address] and check in at Admitting at 6:30 AM.  Plan to go home the same day, you will only stay overnight if medically necessary. You MUST have a responsible adult to drive you home and MUST be with you the first 24 hours after you arrive home or your procedure could be cancelled.  Informed a nurse may call a day before the procedure to confirm arrival time and ensure instructions are followed.  Patient verbalized understanding to information provided and is agreeable to proceed with procedure.   Advised to contact RN Navigator at [phone], to inform of any new medications started after call or concerns prior to procedure.

## 2024-10-06 NOTE — Telephone Encounter (Signed)
 Attempted to reach patient to discuss upcoming procedure, no answer. Left VM for patient to return call.

## 2024-10-15 ENCOUNTER — Other Ambulatory Visit: Payer: Self-pay

## 2024-10-15 DIAGNOSIS — Z01812 Encounter for preprocedural laboratory examination: Secondary | ICD-10-CM | POA: Diagnosis not present

## 2024-10-15 DIAGNOSIS — I480 Paroxysmal atrial fibrillation: Secondary | ICD-10-CM | POA: Diagnosis not present

## 2024-10-16 LAB — BASIC METABOLIC PANEL WITH GFR
BUN/Creatinine Ratio: 19 (ref 12–28)
BUN: 12 mg/dL (ref 8–27)
CO2: 23 mmol/L (ref 20–29)
Calcium: 8.9 mg/dL (ref 8.7–10.3)
Chloride: 98 mmol/L (ref 96–106)
Creatinine, Ser: 0.64 mg/dL (ref 0.57–1.00)
Glucose: 97 mg/dL (ref 70–99)
Potassium: 4.5 mmol/L (ref 3.5–5.2)
Sodium: 136 mmol/L (ref 134–144)
eGFR: 91 mL/min/1.73 (ref >59)

## 2024-10-16 LAB — CBC
Hematocrit: 40.7 % (ref 34.0–46.6)
Hemoglobin: 12.8 g/dL (ref 11.1–15.9)
MCH: 30.5 pg (ref 26.6–33.0)
MCHC: 31.4 g/dL — ABNORMAL LOW (ref 31.5–35.7)
MCV: 97 fL (ref 79–97)
Platelets: 301 x10E3/uL (ref 150–450)
RBC: 4.19 x10E6/uL (ref 3.77–5.28)
RDW: 13.4 % (ref 11.7–15.4)
WBC: 7.4 x10E3/uL (ref 3.4–10.8)

## 2024-10-18 ENCOUNTER — Ambulatory Visit: Payer: Self-pay | Admitting: Cardiology

## 2024-10-27 NOTE — Pre-Procedure Instructions (Signed)
 Instructed patient on the following items: Arrival time 0615 Nothing to eat or drink after midnight No meds AM of procedure Responsible person to drive you home and stay with you for 24 hrs  Have you missed any doses of anti-coagulant Xarelto - takes once a day, hasn't missed any doses.

## 2024-10-27 NOTE — Anesthesia Preprocedure Evaluation (Signed)
 Anesthesia Evaluation  Patient identified by MRN, date of birth, ID band Patient awake    Reviewed: Allergy & Precautions, NPO status , Patient's Chart, lab work & pertinent test results, reviewed documented beta blocker date and time   History of Anesthesia Complications Negative for: history of anesthetic complications  Airway Mallampati: II  TM Distance: >3 FB Neck ROM: Full    Dental  (+) Dental Advisory Given   Pulmonary neg pulmonary ROS   breath sounds clear to auscultation       Cardiovascular hypertension, Pt. on medications and Pt. on home beta blockers (-) angina +CHF (mild reduction in RV systolic function, LVEF 50-55%, grade 1 diastolic dysfunction)  + dysrhythmias (xarelto ) Atrial Fibrillation + Valvular Problems/Murmurs (mild MR) MR  Rhythm:Irregular Rate:Normal  Echo 07/2024:  1. Inferior basal hypokinesis. Left ventricular ejection fraction, by  estimation, is 50 to 55%. The left ventricle has low normal function. The  left ventricle has no regional wall motion abnormalities. Grade I diastolic dysfunction (impaired relaxation). The average left ventricular global longitudinal strain is -12.7 %. The global longitudinal strain is abnormal.   2. RVF is mildly reduced. The right ventricular size is mildly enlarged. There is normal pulmonary artery systolic pressure.   3. Left atrial size was moderately dilated.   4. Right atrial size was moderately dilated.   5. The mitral valve is abnormal. Mild mitral valve regurgitation. No evidence of mitral stenosis.   6. The aortic valve is tricuspid. Aortic valve regurgitation is not visualized. No aortic stenosis is present.   7. The inferior vena cava is normal in size with greater than 50%  respiratory variability, suggesting right atrial pressure of 3 mmHg.     Neuro/Psych  PSYCHIATRIC DISORDERS Anxiety     negative neurological ROS     GI/Hepatic Neg liver ROS,GERD   Controlled and Medicated,,  Endo/Other  negative endocrine ROS    Renal/GU negative Renal ROS  negative genitourinary   Musculoskeletal  (+) Arthritis , Osteoarthritis,    Abdominal   Peds  Hematology negative hematology ROS (+) xarelto    Anesthesia Other Findings   Reproductive/Obstetrics negative OB ROS                              Anesthesia Physical Anesthesia Plan  ASA: 2  Anesthesia Plan: General   Post-op Pain Management: Minimal or no pain anticipated   Induction: Intravenous  PONV Risk Score and Plan: Ondansetron, Dexamethasone and Treatment may vary due to age or medical condition  Airway Management Planned: Oral ETT  Additional Equipment: None  Intra-op Plan:   Post-operative Plan: Extubation in OR  Informed Consent: I have reviewed the patients History and Physical, chart, labs and discussed the procedure including the risks, benefits and alternatives for the proposed anesthesia with the patient or authorized representative who has indicated his/her understanding and acceptance.     Dental advisory given  Plan Discussed with: CRNA and Surgeon  Anesthesia Plan Comments:          Anesthesia Quick Evaluation

## 2024-10-28 ENCOUNTER — Other Ambulatory Visit: Payer: Self-pay

## 2024-10-28 ENCOUNTER — Ambulatory Visit (HOSPITAL_COMMUNITY)
Admission: RE | Disposition: A | Discharge status: Home / Self Care | Payer: Self-pay | Source: Home / Self Care | Attending: Cardiology

## 2024-10-28 ENCOUNTER — Ambulatory Visit (HOSPITAL_COMMUNITY)
Admission: RE | Admit: 2024-10-28 | Discharge: 2024-10-28 | Disposition: A | Discharge status: Home / Self Care | Attending: Cardiology | Admitting: Cardiology

## 2024-10-28 ENCOUNTER — Ambulatory Visit (HOSPITAL_COMMUNITY): Payer: Self-pay | Admitting: Anesthesiology

## 2024-10-28 DIAGNOSIS — I480 Paroxysmal atrial fibrillation: Secondary | ICD-10-CM

## 2024-10-28 DIAGNOSIS — K219 Gastro-esophageal reflux disease without esophagitis: Secondary | ICD-10-CM | POA: Insufficient documentation

## 2024-10-28 DIAGNOSIS — I10 Essential (primary) hypertension: Secondary | ICD-10-CM | POA: Insufficient documentation

## 2024-10-28 DIAGNOSIS — I4892 Unspecified atrial flutter: Secondary | ICD-10-CM | POA: Insufficient documentation

## 2024-10-28 DIAGNOSIS — D6869 Other thrombophilia: Secondary | ICD-10-CM | POA: Insufficient documentation

## 2024-10-28 DIAGNOSIS — M170 Bilateral primary osteoarthritis of knee: Secondary | ICD-10-CM | POA: Diagnosis not present

## 2024-10-28 DIAGNOSIS — I700 Atherosclerosis of aorta: Secondary | ICD-10-CM | POA: Insufficient documentation

## 2024-10-28 DIAGNOSIS — I7300 Raynaud's syndrome without gangrene: Secondary | ICD-10-CM | POA: Insufficient documentation

## 2024-10-28 DIAGNOSIS — Z79899 Other long term (current) drug therapy: Secondary | ICD-10-CM | POA: Diagnosis not present

## 2024-10-28 DIAGNOSIS — Z7901 Long term (current) use of anticoagulants: Secondary | ICD-10-CM | POA: Diagnosis not present

## 2024-10-28 HISTORY — PX: ATRIAL FIBRILLATION ABLATION: EP1191

## 2024-10-28 LAB — POCT ACTIVATED CLOTTING TIME: Activated Clotting Time: 363 s

## 2024-10-28 MED ORDER — PROTAMINE SULFATE 10 MG/ML IV SOLN
INTRAVENOUS | Status: DC | PRN
  Administered 2024-10-28: 35 mg via INTRAVENOUS

## 2024-10-28 MED ORDER — PHENYLEPHRINE 80 MCG/ML (10ML) SYRINGE FOR IV PUSH (FOR BLOOD PRESSURE SUPPORT)
PREFILLED_SYRINGE | INTRAVENOUS | Status: DC | PRN
  Administered 2024-10-28 (×3): 80 ug via INTRAVENOUS

## 2024-10-28 MED ORDER — ACETAMINOPHEN 500 MG PO TABS
1000.0000 mg | ORAL_TABLET | Freq: Once | ORAL | Status: AC
  Administered 2024-10-28: 1000 mg via ORAL
  Filled 2024-10-28: qty 2

## 2024-10-28 MED ORDER — RIVAROXABAN 20 MG PO TABS: 20.0000 mg | ORAL_TABLET | Freq: Once | ORAL | Status: DC

## 2024-10-28 MED ORDER — SODIUM CHLORIDE 0.9 % IV SOLN: 250.0000 mL | INTRAVENOUS | Status: DC | PRN

## 2024-10-28 MED ORDER — MIDAZOLAM HCL (PF) 2 MG/2ML IJ SOLN: 0.5000 mg | Freq: Once | INTRAMUSCULAR | Status: DC | PRN

## 2024-10-28 MED ORDER — PROPOFOL 10 MG/ML IV BOLUS
INTRAVENOUS | Status: DC | PRN
  Administered 2024-10-28: 100 ug/kg/min via INTRAVENOUS
  Administered 2024-10-28: 100 mg via INTRAVENOUS

## 2024-10-28 MED ORDER — MEPERIDINE HCL 25 MG/ML IJ SOLN: 6.2500 mg | INTRAMUSCULAR | Status: DC | PRN

## 2024-10-28 MED ORDER — DEXAMETHASONE SOD PHOSPHATE PF 10 MG/ML IJ SOLN
INTRAMUSCULAR | Status: DC | PRN
  Administered 2024-10-28: 6 mg via INTRAVENOUS

## 2024-10-28 MED ORDER — LIDOCAINE 2% (20 MG/ML) 5 ML SYRINGE
INTRAMUSCULAR | Status: DC | PRN
  Administered 2024-10-28: 60 mg via INTRAVENOUS

## 2024-10-28 MED ORDER — FENTANYL CITRATE (PF) 250 MCG/5ML IJ SOLN
INTRAMUSCULAR | Status: DC | PRN
  Administered 2024-10-28 (×2): 25 ug via INTRAVENOUS
  Administered 2024-10-28: 50 ug via INTRAVENOUS

## 2024-10-28 MED ORDER — ATROPINE SULFATE 1 MG/10ML IJ SOSY
PREFILLED_SYRINGE | INTRAMUSCULAR | Status: DC | PRN
  Administered 2024-10-28: 1 mg via INTRAVENOUS

## 2024-10-28 MED ORDER — HEPARIN SODIUM (PORCINE) 1000 UNIT/ML IJ SOLN
INTRAMUSCULAR | Status: DC | PRN
  Administered 2024-10-28: 12000 [IU] via INTRAVENOUS

## 2024-10-28 MED ORDER — SODIUM CHLORIDE 0.9% FLUSH: 3.0000 mL | INTRAVENOUS | Status: DC | PRN

## 2024-10-28 MED ORDER — ACETAMINOPHEN 325 MG PO TABS: 650.0000 mg | ORAL_TABLET | ORAL | Status: DC | PRN

## 2024-10-28 MED ORDER — SUGAMMADEX SODIUM 200 MG/2ML IV SOLN
INTRAVENOUS | Status: DC | PRN
  Administered 2024-10-28: 100 mg via INTRAVENOUS

## 2024-10-28 MED ORDER — ONDANSETRON HCL 4 MG/2ML IJ SOLN
INTRAMUSCULAR | Status: DC | PRN
  Administered 2024-10-28: 4 mg via INTRAVENOUS

## 2024-10-28 MED ORDER — OXYCODONE HCL 5 MG/5ML PO SOLN: 5.0000 mg | Freq: Once | ORAL | Status: DC | PRN

## 2024-10-28 MED ORDER — OXYCODONE HCL 5 MG PO TABS: 5.0000 mg | ORAL_TABLET | Freq: Once | ORAL | Status: DC | PRN

## 2024-10-28 MED ORDER — FENTANYL CITRATE (PF) 100 MCG/2ML IJ SOLN
INTRAMUSCULAR | Status: AC
  Filled 2024-10-28: qty 2

## 2024-10-28 MED ORDER — PHENYLEPHRINE HCL-NACL 20-0.9 MG/250ML-% IV SOLN
INTRAVENOUS | Status: DC | PRN
  Administered 2024-10-28: 30 ug/min via INTRAVENOUS

## 2024-10-28 MED ORDER — SODIUM CHLORIDE 0.9 % IV SOLN: INTRAVENOUS | Status: DC

## 2024-10-28 MED ORDER — PHENYLEPHRINE HCL-NACL 20-0.9 MG/250ML-% IV SOLN
INTRAVENOUS | Status: AC
  Filled 2024-10-28: qty 500

## 2024-10-28 MED ORDER — ONDANSETRON HCL 4 MG/2ML IJ SOLN: 4.0000 mg | Freq: Four times a day (QID) | INTRAMUSCULAR | Status: DC | PRN

## 2024-10-28 MED ORDER — PROPOFOL 1000 MG/100ML IV EMUL
INTRAVENOUS | Status: AC
  Filled 2024-10-28: qty 200

## 2024-10-28 MED ORDER — FENTANYL CITRATE (PF) 100 MCG/2ML IJ SOLN: 25.0000 ug | INTRAMUSCULAR | Status: DC | PRN

## 2024-10-28 MED ORDER — ROCURONIUM BROMIDE 10 MG/ML (PF) SYRINGE
PREFILLED_SYRINGE | INTRAVENOUS | Status: DC | PRN
  Administered 2024-10-28: 10 mg via INTRAVENOUS
  Administered 2024-10-28: 50 mg via INTRAVENOUS
  Administered 2024-10-28: 15 mg via INTRAVENOUS

## 2024-10-28 MED ORDER — SODIUM CHLORIDE 0.9% FLUSH: 3.0000 mL | Freq: Two times a day (BID) | INTRAVENOUS | Status: DC

## 2024-10-28 NOTE — H&P (Signed)
 Electrophysiology Note:   Date:  10/28/24  ID:  Charlotte Williams, DOB 07-12-1946, MRN [id number]   Primary Cardiologist: None Electrophysiologist: Charlotte Kitty, MD       History of Present Illness:   Charlotte Williams is a 78 y.o. female with h/o HTN, aortic atherosclerosis by CT imaging, arthritis, syncope, and atrial fibrillation who is being seen today for evaluation for catheter ablation and Watchman at the request of Charlotte Williams, [place].   Discussed the use of AI scribe software for clinical note transcription with the patient, who gave verbal consent to proceed.   History of Present Illness Charlotte Williams is a 78 year old female with atrial fibrillation who presents for evaluation of her condition and management options. She was referred by Dr. Larnell Williams from the AFib clinic for further evaluation.   She experiences atrial fibrillation. A notable episode occurred on June 19th when she fainted at dinner after consuming a small meal and a few sips of a margarita. During this episode, she was assisted by a doctor who was present at the restaurant.   She wore a monitor which indicated that she was in AFib 20% of the time. Despite this, she does not feel any severe symptoms typically associated with Afib. She feels healthy overall and does not always notice when she is in AFib.   She is currently on Xarelto  for stroke prevention due to her AFib. She also has arthritis, which is exacerbated by her inability to take certain arthritis medications due to her blood thinner. Her arthritis mainly affects her knees, making it difficult to go up and down stairs. She has noticed this more recently. She also has Raynaud's, which causes her discomfort in cold weather.   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 She is cautious about her movements to avoid falls and believes her neck pain may be from constantly looking down.    Interval: Patient presents today for  planned ablation. Reports feeling relatively well. She is off NSAIDs for her arthritis while on DOAC which has caused more joint pain. Otherwise doing well with no new or acute complaints.   Review of systems complete and found to be negative unless listed in HPI.    EP Information / Studies Reviewed:     EKG is not ordered today. EKG from 07/14/24 reviewed which showed sinus rhythm.        Zio 06/19/24:  NSR with lower rate of 63/min and upper rate of 142/min, ave HR 99/min. Sustained atrial fib with a RVR present, with 20% burden, longest episode 24 hours.  With rates from 86 to 239/min., associated with with symptoms. Ave rate in atrial fib was 141/min. Episodes of NS SVT present vs atrial flutter with a RVR at rates up to 261/min  (likely 1:1 flutter) No prolonged pauses 2% PAC's and 1% PVC's NSVT is present though could also be abherrently conducted aflutter. Symptoms associated with atrial fib.  Patch Wear Time:  13 days and 21 hours (2025-07-07T17:14:54-0400 to 2025-07-21T15:06:31-0400)   Risk Assessment/Calculations:     CHA2DS2-VASc Score = 4   This indicates a 4.8% annual risk of stroke. The patient's score is based upon: CHF History: 0 HTN History: 1 Diabetes History: 0 Stroke History: 0 Vascular Disease History: 0 Age Score: 2 Gender Score: 1           Physical Exam:    Today's Vitals   10/28/24 0647  BP: ROLLEN)  137/93  Pulse: 84  Resp: 18  Temp: 98.3 F (36.8 C)  TempSrc: Oral  SpO2: 99%  Weight: 49.9 kg  Height: 5' 2 (1.575 m)  PainSc: 0-No pain   Body mass index is 20.12 kg/m.   GEN: Well nourished, well developed in no acute distress NECK: No JVD CARDIAC: Normal rate, regular rhythm RESPIRATORY:  Clear to auscultation without rales, wheezing or rhonchi  ABDOMEN: Soft, non-distended EXTREMITIES:  No edema; No deformity    ASSESSMENT AND PLAN:     #Paroxysmal atrial fibrillation: Intermittent episodes of atrial fibrillation, occurring 20% of  the time based on recent monitoring.  #Atrial flutter:  -Discussed treatment options today for AF including antiarrhythmic drug therapy and ablation. Discussed risks, recovery and likelihood of success with each treatment strategy. Risk, benefits, and alternatives to EP study and ablation for afib were discussed. These risks include but are not limited to stroke, bleeding, vascular damage, tamponade, perforation, damage to the esophagus, lungs, phrenic nerve and other structures, pulmonary vein stenosis, worsening renal function, coronary vasospasm and death.  Discussed potential need for repeat ablation procedures and antiarrhythmic drugs after an initial ablation. The patient understands these risk and wishes to proceed.  We will therefore proceed with catheter ablation today.   #Hypercoagulable state due to AF/AFL #Chronic arthritis - Originally we had hoped for concomitant Watchman and ablation so that patient could resume NSAIDs. Unfortuntately, LAA anatomy is not suitable for Watchman. We will continue DOAC for now with plans to stop at 60-90 days if no recurrence and reliable rhythm monitoring is in place.    Follow up with Dr. Kennyth 3 months after ablation.   Signed, Charlotte Kennyth, MD

## 2024-10-28 NOTE — Discharge Instructions (Addendum)
 Cardiac Ablation, Care After  This sheet gives you information about how to care for yourself after your procedure. Your health care provider may also give you more specific instructions. If you have problems or questions, contact your health care provider. What can I expect after the procedure? After the procedure, it is common to have: Bruising around your puncture site. Tenderness around your puncture site. Skipped heartbeats. If you had an atrial fibrillation ablation, you may have atrial fibrillation during the first several months after your procedure.  Tiredness (fatigue).  Follow these instructions at home: Puncture site care  Follow instructions from your health care provider about how to take care of your puncture site. Make sure you: If present, leave stitches (sutures), skin glue, or adhesive strips in place. These skin closures may need to stay in place for up to 2 weeks. If adhesive strip edges start to loosen and curl up, you may trim the loose edges. Do not remove adhesive strips completely unless your health care provider tells you to do that. If a large square bandage is present, this may be removed 24 hours after surgery.  Check your puncture site every day for signs of infection. Check for: Redness, swelling, or pain. Fluid or blood. If your puncture site starts to bleed, lie down on your back, apply firm pressure to the area, and contact your health care provider. Warmth. Pus or a bad smell. A pea or marble sized lump/knot at the site is normal and can take up to three months to resolve.  Driving Do not drive for at least 4 days after your procedure or however long your health care provider recommends. (Do not resume driving if you have previously been instructed not to drive for other health reasons.) Do not drive or use heavy machinery while taking prescription pain medicine. Activity Avoid activities that take a lot of effort for at least 7 days after your  procedure. Do not lift anything that is heavier than 5 lb (4.5 kg) for one week.  No sexual activity for 1 week.  Return to your normal activities as told by your health care provider. Ask your health care provider what activities are safe for you. General instructions Take over-the-counter and prescription medicines only as told by your health care provider. Do not use any products that contain nicotine or tobacco, such as cigarettes and e-cigarettes. If you need help quitting, ask your health care provider. You may shower after 24 hours, but Do not take baths, swim, or use a hot tub for 1 week.  Do not drink alcohol for 24 hours after your procedure. Keep all follow-up visits as told by your health care provider. This is important. Contact a health care provider if: You have redness, mild swelling, or pain around your puncture site. You have fluid or blood coming from your puncture site that stops after applying firm pressure to the area. Your puncture site feels warm to the touch. You have pus or a bad smell coming from your puncture site. You have a fever. You have chest pain or discomfort that spreads to your neck, jaw, or arm. You have chest pain that is worse with lying on your back or taking a deep breath. You are sweating a lot. You feel nauseous. You have a fast or irregular heartbeat. You have shortness of breath. You are dizzy or light-headed and feel the need to lie down. You have pain or numbness in the arm or leg closest to your puncture site.  Get help right away if: Your puncture site suddenly swells. Your puncture site is bleeding and the bleeding does not stop after applying firm pressure to the area. These symptoms may represent a serious problem that is an emergency. Do not wait to see if the symptoms will go away. Get medical help right away. Call your local emergency services (911 in the [place] Do not drive yourself to the hospital. Summary After the procedure, it  is normal to have bruising and tenderness at the puncture site in your groin, neck, or forearm. Check your puncture site every day for signs of infection. Get help right away if your puncture site is bleeding and the bleeding does not stop after applying firm pressure to the area. This is a medical emergency. This information is not intended to replace advice given to you by your health care provider. Make sure you discuss any questions you have with your health care provider.  Femoral Site Care This sheet gives you information about how to care for yourself after your procedure. Your health care provider may also give you more specific instructions. If you have problems or questions, contact your health care provider. What can I expect after the procedure?  After the procedure, it is common to have: Bruising that usually fades within 1-2 weeks. Tenderness at the site. Follow these instructions at home: Wound care Follow instructions from your health care provider about how to take care of your insertion site. Make sure you: Wash your hands with soap and water before you change your bandage (dressing). If soap and water are not available, use hand sanitizer. Remove your dressing as told by your health care provider. In 24 hours Do not take baths, swim, or use a hot tub until your health care provider approves. You may shower 24-48 hours after the procedure or as told by your health care provider. Gently wash the site with plain soap and water. Pat the area dry with a clean towel. Do not rub the site. This may cause bleeding. Do not apply powder or lotion to the site. Keep the site clean and dry. Check your femoral site every day for signs of infection. Check for: Redness, swelling, or pain. Fluid or blood. Warmth. Pus or a bad smell. Activity For the first 2-3 days after your procedure, or as long as directed: Avoid climbing stairs as much as possible. Do not squat. Do not lift  anything that is heavier than 10 lb (4.5 kg), or the limit that you are told, until your health care provider says that it is safe. For 5 days Rest as directed. Avoid sitting for a long time without moving. Get up to take short walks every 1-2 hours. Do not drive for 24 hours if you were given a medicine to help you relax (sedative). General instructions Take over-the-counter and prescription medicines only as told by your health care provider. Keep all follow-up visits as told by your health care provider. This is important. Contact a health care provider if you have: A fever or chills. You have redness, swelling, or pain around your insertion site. Get help right away if: The catheter insertion area swells very fast. You pass out. You suddenly start to sweat or your skin gets clammy. The catheter insertion area is bleeding, and the bleeding does not stop when you hold steady pressure on the area. The area near or just beyond the catheter insertion site becomes pale, cool, tingly, or numb. These symptoms may represent a serious  problem that is an emergency. Do not wait to see if the symptoms will go away. Get medical help right away. Call your local emergency services (911 in the [place] Do not drive yourself to the hospital. Summary After the procedure, it is common to have bruising that usually fades within 1-2 weeks. Check your femoral site every day for signs of infection. Do not lift anything that is heavier than 10 lb (4.5 kg), or the limit that you are told, until your health care provider says that it is safe. This information is not intended to replace advice given to you by your health care provider. Make sure you discuss any questions you have with your health care provider. Document Revised: 11/25/2017 Document Reviewed: 11/25/2017 Elsevier Patient Education  2020 [employer].

## 2024-10-28 NOTE — Transfer of Care (Signed)
 Immediate Anesthesia Transfer of Care Note  Patient: Charlotte Williams  Procedure(s) Performed: ATRIAL FIBRILLATION ABLATION  Patient Location: PACU  Anesthesia Type:General  Level of Consciousness: awake and drowsy  Airway & Oxygen Therapy: Patient Spontanous Breathing  Post-op Assessment: Report given to RN  Post vital signs: Reviewed and stable  Last Vitals:  Vitals Value Taken Time  BP 96/65 10/28/24 10:36  Temp 36.6 C 10/28/24 10:36  Pulse 84 10/28/24 10:37  Resp 18 10/28/24 10:37  SpO2 96 % 10/28/24 10:37  Vitals shown include unfiled device data.  Last Pain:  Vitals:   10/28/24 1036  TempSrc: Oral  PainSc: 0-No pain         Complications: There were no known notable events for this encounter.

## 2024-10-28 NOTE — Progress Notes (Signed)
 Discharge instructions reviewed with patient and friend Slater at the bedside. Denies questions or concerns. PT ambulated in the hallway was able to void in the bathroom without difficulty. No s/s of complications at the incision site. PT seen by Dr. Kennyth after ambulation and was clear fro discahrge. PT escorted rom te unit via wheel chair to personal vehicle.

## 2024-10-28 NOTE — Anesthesia Postprocedure Evaluation (Signed)
 Anesthesia Post Note  Patient: Charlotte Williams  Procedure(s) Performed: ATRIAL FIBRILLATION ABLATION     Patient location during evaluation: Phase II Anesthesia Type: General Level of consciousness: awake and alert, patient cooperative and oriented Pain management: pain level controlled Vital Signs Assessment: post-procedure vital signs reviewed and stable Respiratory status: spontaneous breathing, nonlabored ventilation and respiratory function stable Cardiovascular status: blood pressure returned to baseline and stable Postop Assessment: no apparent nausea or vomiting Anesthetic complications: no   There were no known notable events for this encounter.  Last Vitals:  Vitals:   10/28/24 1254 10/28/24 1300  BP: 123/80 121/83  Pulse: 76 77  Resp: 13 12  Temp:    SpO2: 97% 96%    Last Pain:  Vitals:   10/28/24 1132  TempSrc:   PainSc: 0-No pain                 Leetta Hendriks,E. Worth Kober

## 2024-10-28 NOTE — Anesthesia Procedure Notes (Signed)
 Procedure Name: Intubation Date/Time: 10/28/2024 8:41 AM  Performed by: Delores Duwaine SAUNDERS, CRNAPre-anesthesia Checklist: Patient identified, Emergency Drugs available, Suction available and Patient being monitored Patient Re-evaluated:Patient Re-evaluated prior to induction Oxygen Delivery Method: Circle System Utilized Preoxygenation: Pre-oxygenation with 100% oxygen Induction Type: IV induction Ventilation: Mask ventilation without difficulty Laryngoscope Size: Mac and 3 Grade View: Grade I Tube type: Oral Tube size: 6.5 mm Number of attempts: 1 Airway Equipment and Method: Stylet and Oral airway Placement Confirmation: ETT inserted through vocal cords under direct vision, positive ETCO2 and breath sounds checked- equal and bilateral Secured at: 20 cm Tube secured with: Tape Dental Injury: Teeth and Oropharynx as per pre-operative assessment

## 2024-10-29 ENCOUNTER — Telehealth (HOSPITAL_COMMUNITY): Payer: Self-pay

## 2024-10-29 ENCOUNTER — Encounter (HOSPITAL_COMMUNITY): Payer: Self-pay | Admitting: Cardiology

## 2024-10-29 MED FILL — Fentanyl Citrate Preservative Free (PF) Inj 100 MCG/2ML: INTRAMUSCULAR | Qty: 2 | Status: AC

## 2024-10-29 MED FILL — Propofol IV Emul 1000 MG/100ML (10 MG/ML): INTRAVENOUS | Qty: 67.48 | Status: AC

## 2024-10-29 NOTE — Telephone Encounter (Signed)
 Spoke with patient to complete post procedure follow up call.  Patient reports no complications with groin sites.   Instructions reviewed with patient:  Remove large bandage at puncture site after 24 hours. It is normal to have bruising, tenderness, mild swelling, and a pea or marble sized lump/knot at the groin site which can take up to three months to resolve.  Get help right away if you notice sudden swelling at the puncture site.  Check your puncture site every day for signs of infection: fever, redness, swelling, pus drainage, warmth, foul odor or excessive pain. If this occurs, please call [phone], to speak with the RN Navigator. Get help right away if your puncture site is bleeding and the bleeding does not stop after applying firm pressure to the area.  You may continue to have skipped beats/ atrial fibrillation during the first several months after your procedure.  It is very important not to miss any doses of your blood thinner Xarelto .    You will follow up with the APP 4 weeks after your procedure and follow up with the APP 3 months after your procedure.  Activity restrictions reviewed.  Patient verbalized understanding to all instructions provided.

## 2024-11-24 NOTE — Progress Notes (Unsigned)
 "     Electrophysiology Clinic Note    Date:  11/25/2024  Patient ID:  Charlotte Williams, Charlotte Williams 1946-07-02, MRN [id number] PCP:  Bulah Alm RAMAN, PA-C  Cardiologist:  None  Electrophysiologist:  Fonda Kitty, MD  Electrophysiology APP:  Stanislaus Kaltenbach, NP     Discussed the use of AI scribe software for clinical note transcription with the patient, who gave verbal consent to proceed.   Patient Profile    Chief Complaint: Af ablation follow-up  History of Present Illness: Charlotte Williams is a 78 y.o. female with PMH notable for parox AFib, syncope, HTN; seen today for Fonda Kitty, MD for routine electrophysiology follow-up s/p Ablation.  She is s/p AF ablation w isolation of pulm veins, posterior wall on 10/28/2024 by Dr. Kitty.   On follow-up today, she has not had any atrial fibrillation episodes that she is aware of since ablation.  She wears Apple watch daily but is not aware of A-fib monitoring capabilities.  She has not had any further syncopal episodes.  She has noticed brief episodes of needing to catch her breath that occur when she is active, she gives the example of running downstairs to get something from her car and running back upstairs and then bending over where she has a very brief sensation that she needs to take a deep breath.  She denies chest pain, chest pressure, dizziness or presyncope.  She continues to take Xarelto  daily and questions when/if she will be able to stop it so that she can continue her NSAIDs for arthritis.  No bleeding concerns on Xarelto .  She has no concerns related to her groin access sites. Denies tenderness, swelling, drainage. She states her R groin was more tender than she was anticipating after procedure.      Arrhythmia/Device History No specialty comments available.    ROS:  Please see the history of present illness. All other systems are reviewed and otherwise negative.    Physical Exam    VS:  BP 130/68 (BP Location: Left Arm,  Patient Position: Sitting, Cuff Size: Normal)   Pulse 86   Ht 5' 2 (1.575 m)   Wt 115 lb 3.2 oz (52.3 kg)   SpO2 97%   BMI 21.07 kg/m  BMI: Body mass index is 21.07 kg/m.           Wt Readings from Last 3 Encounters:  11/25/24 115 lb 3.2 oz (52.3 kg)  10/28/24 110 lb (49.9 kg)  08/06/24 114 lb (51.7 kg)     GEN- The patient is well appearing, alert and oriented x 3 today.   Lungs- Clear to ausculation bilaterally, normal work of breathing.  Heart- Regular rate and rhythm, no murmurs, rubs or gallops Extremities- No peripheral edema, warm, dry   Studies Reviewed   Previous EP, cardiology notes.    EKG is ordered. Personal review of EKG from today shows:    EKG Interpretation Date/Time:  Wednesday November 25 2024 13:59:34 EST Ventricular Rate:  86 PR Interval:  118 QRS Duration:  94 QT Interval:  354 QTC Calculation: 423 R Axis:   48  Text Interpretation: Normal sinus rhythm Normal ECG Confirmed by Marda Breidenbach [phone]) on 11/25/2024 2:01:41 PM    TTE, 08/10/2024  1. Inferior basal hypokinesis. Left ventricular ejection fraction, by estimation, is 50 to 55%. The left ventricle has low normal function. The left ventricle has no regional wall motion abnormalities. Left ventricular diastolic parameters are consistent with Grade I diastolic dysfunction (impaired relaxation).  The average left ventricular global longitudinal strain is -12.7 %. The global longitudinal strain is abnormal.   2. Right ventricular systolic function is mildly reduced. The right ventricular size is mildly enlarged. There is normal pulmonary artery systolic pressure.   3. Left atrial size was moderately dilated.   4. Right atrial size was moderately dilated.   5. The mitral valve is abnormal. Mild mitral valve regurgitation. No evidence of mitral stenosis.   6. The aortic valve is tricuspid. Aortic valve regurgitation is not visualized. No aortic stenosis is present.   7. The inferior vena cava is  normal in size with greater than 50% respiratory variability, suggesting right atrial pressure of 3 mmHg.   Long term monitor, 06/19/2024 NSR with lower rate of 63/min and upper rate of 142/min, ave HR 99/min. Sustained atrial fib with a RVR present, with 20% burden, longest episode 24 hours.  With rates from 86 to 239/min., associated with with symptoms. Ave rate in atrial fib was 141/min. Episodes of NS SVT present vs atrial flutter with a RVR at rates up to 261/min  (likely 1:1 flutter) No prolonged pauses 2% PAC's and 1% PVC's NSVT is present though could also be abherrently conducted aflutter. Symptoms associated with atrial fib.   Assessment and Plan     #) parox Afib #) syncope S/p AF ablation 10/2024 No further syncopal episodes Appears to be maintaining sinus rhythm She will research AFib monitoring using apple watch Continue 25mg  toprol  daily  #) Hypercoag d/t parox afib CHA2DS2-VASc Score = at least 4 [CHF History: 0, HTN History: 1, Diabetes History: 0, Stroke History: 0, Vascular Disease History: 0, Age Score: 2, Gender Score: 1].  Therefore, the patient's annual risk of stroke is 4.8 %.    Stroke ppx - 20mg  xarelto  daily, appropriately dosed No bleeding concerns She is not watchman candidate d/t LAA anatomy Will continue xarelto  at this time, consider stopping at 3 mon post-ablation appt if maintaining sinus rhythm with monitoring strategy in place        Current medicines are reviewed at length with the patient today.   The patient has concerns regarding her medicines.  The following changes were made today:  none  Labs/ tests ordered today include:  Orders Placed This Encounter  Procedures   EKG 12-Lead     Disposition: Follow up with Dr. Kennyth or EP APP in 2 months    Signed, Marce Charlesworth, NP  11/25/2024  3:42 PM  Electrophysiology [facility] "

## 2024-11-25 ENCOUNTER — Encounter: Payer: Self-pay | Admitting: Cardiology

## 2024-11-25 ENCOUNTER — Ambulatory Visit: Admitting: Cardiology

## 2024-11-25 VITALS — BP 130/68 | HR 86 | Ht 62.0 in | Wt 115.2 lb

## 2024-11-25 DIAGNOSIS — I480 Paroxysmal atrial fibrillation: Secondary | ICD-10-CM

## 2024-11-25 DIAGNOSIS — D6869 Other thrombophilia: Secondary | ICD-10-CM

## 2024-11-25 NOTE — Patient Instructions (Signed)
 Medication Instructions:  Your physician recommends that you continue on your current medications as directed. Please refer to the Current Medication list given to you today.    *If you need a refill on your cardiac medications before your next appointment, please call your pharmacy*  Lab Work: No labs ordered today    Testing/Procedures: No test ordered today   Follow-Up: At [facility], you and your health needs are our priority.  As part of our continuing mission to provide you with exceptional heart care, our providers are all part of one team.  This team includes your primary Cardiologist (physician) and Advanced Practice Providers or APPs (Physician Assistants and Nurse Practitioners) who all work together to provide you with the care you need, when you need it.  Your next appointment:   2 month(s)  Provider:   Suzann Riddle, NP

## 2024-12-01 ENCOUNTER — Ambulatory Visit (INDEPENDENT_AMBULATORY_CARE_PROVIDER_SITE_OTHER): Admitting: *Deleted

## 2024-12-01 VITALS — Ht 62.0 in | Wt 115.0 lb

## 2024-12-01 DIAGNOSIS — Z0000 Encounter for general adult medical examination without abnormal findings: Secondary | ICD-10-CM | POA: Diagnosis not present

## 2024-12-01 NOTE — Patient Instructions (Signed)
 Charlotte Williams,  Thank you for taking the time for your Medicare Wellness Visit. I appreciate your continued commitment to your health goals. Please review the care plan we discussed, and feel free to reach out if I can assist you further.  Please note that Annual Wellness Visits do not include a physical exam. Some assessments may be limited, especially if the visit was conducted virtually. If needed, we may recommend an in-person follow-up with your provider.  Ongoing Care Seeing your primary care provider every 3 to 6 months helps us  monitor your health and provide consistent, personalized care.   Referrals If a referral was made during today's visit and you haven't received any updates within two weeks, please contact the referred provider directly to check on the status.  Recommended Screenings:  Health Maintenance  Topic Date Due   COVID-19 Vaccine (8 - 2025-26 season) 07/27/2024   DTaP/Tdap/Td vaccine (1 - Tdap) 07/02/2025*   Medicare Annual Wellness Visit  12/01/2025   Pneumococcal Vaccine for age over 41  Completed   Flu Shot  Completed   Osteoporosis screening with Bone Density Scan  Completed   Hepatitis C Screening  Completed   Zoster (Shingles) Vaccine  Completed   Meningitis B Vaccine  Aged Out   Breast Cancer Screening  Discontinued   Colon Cancer Screening  Discontinued  *Topic was postponed. The date shown is not the original due date.       12/01/2024    2:08 PM  Advanced Directives  Does Patient Have a Medical Advance Directive? Yes  Type of Advance Directive Healthcare Power of Attorney  Copy of Healthcare Power of Attorney in Chart? No - copy requested    Vision: Annual vision screenings are recommended for early detection of glaucoma, cataracts, and diabetic retinopathy. These exams can also reveal signs of chronic conditions such as diabetes and high blood pressure.  Dental: Annual dental screenings help detect early signs of oral cancer, gum disease, and  other conditions linked to overall health, including heart disease and diabetes.  Please see the attached documents for additional preventive care recommendations.    Charlotte Williams , Thank you for taking time to come for your Medicare Wellness Visit. I appreciate your ongoing commitment to your health goals. Please review the following plan we discussed and let me know if I can assist you in the future.   Screening recommendations/referrals: Colonoscopy:  Mammogram:  Bone Density:  Recommended yearly ophthalmology/optometry visit for glaucoma screening and checkup Recommended yearly dental visit for hygiene and checkup  Vaccinations: Influenza vaccine:  Pneumococcal vaccine:  Tdap vaccine:  Shingles vaccine:       Preventive Care 65 Years and Older, Female Preventive care refers to lifestyle choices and visits with your health care provider that can promote health and wellness. What does preventive care include? A yearly physical exam. This is also called an annual well check. Dental exams once or twice a year. Routine eye exams. Ask your health care provider how often you should have your eyes checked. Personal lifestyle choices, including: Daily care of your teeth and gums. Regular physical activity. Eating a healthy diet. Avoiding tobacco and drug use. Limiting alcohol use. Practicing safe sex. Taking low-dose aspirin every day. Taking vitamin and mineral supplements as recommended by your health care provider. What happens during an annual well check? The services and screenings done by your health care provider during your annual well check will depend on your age, overall health, lifestyle risk factors, and family history  of disease. Counseling  Your health care provider may ask you questions about your: Alcohol use. Tobacco use. Drug use. Emotional well-being. Home and relationship well-being. Sexual activity. Eating habits. History of falls. Memory and ability to  understand (cognition). Work and work astronomer. Reproductive health. Screening  You may have the following tests or measurements: Height, weight, and BMI. Blood pressure. Lipid and cholesterol levels. These may be checked every 5 years, or more frequently if you are over 98 years old. Skin check. Lung cancer screening. You may have this screening every year starting at age 83 if you have a 30-pack-year history of smoking and currently smoke or have quit within the past 15 years. Fecal occult blood test (FOBT) of the stool. You may have this test every year starting at age 60. Flexible sigmoidoscopy or colonoscopy. You may have a sigmoidoscopy every 5 years or a colonoscopy every 10 years starting at age 81. Hepatitis C blood test. Hepatitis B blood test. Sexually transmitted disease (STD) testing. Diabetes screening. This is done by checking your blood sugar (glucose) after you have not eaten for a while (fasting). You may have this done every 1-3 years. Bone density scan. This is done to screen for osteoporosis. You may have this done starting at age 40. Mammogram. This may be done every 1-2 years. Talk to your health care provider about how often you should have regular mammograms. Talk with your health care provider about your test results, treatment options, and if necessary, the need for more tests. Vaccines  Your health care provider may recommend certain vaccines, such as: Influenza vaccine. This is recommended every year. Tetanus, diphtheria, and acellular pertussis (Tdap, Td) vaccine. You may need a Td booster every 10 years. Zoster vaccine. You may need this after age 36. Pneumococcal 13-valent conjugate (PCV13) vaccine. One dose is recommended after age 1. Pneumococcal polysaccharide (PPSV23) vaccine. One dose is recommended after age 60. Talk to your health care provider about which screenings and vaccines you need and how often you need them. This information is not  intended to replace advice given to you by your health care provider. Make sure you discuss any questions you have with your health care provider. Document Released: 12/09/2015 Document Revised: 08/01/2016 Document Reviewed: 09/13/2015 Elsevier Interactive Patient Education  2017 [employer].  Fall Prevention in the Home Falls can cause injuries. They can happen to people of all ages. There are many things you can do to make your home safe and to help prevent falls. What can I do on the outside of my home? Regularly fix the edges of walkways and driveways and fix any cracks. Remove anything that might make you trip as you walk through a door, such as a raised step or threshold. Trim any bushes or trees on the path to your home. Use bright outdoor lighting. Clear any walking paths of anything that might make someone trip, such as rocks or tools. Regularly check to see if handrails are loose or broken. Make sure that both sides of any steps have handrails. Any raised decks and porches should have guardrails on the edges. Have any leaves, snow, or ice cleared regularly. Use sand or salt on walking paths during winter. Clean up any spills in your garage right away. This includes oil or grease spills. What can I do in the bathroom? Use night lights. Install grab bars by the toilet and in the tub and shower. Do not use towel bars as grab bars. Use non-skid mats or  decals in the tub or shower. If you need to sit down in the shower, use a plastic, non-slip stool. Keep the floor dry. Clean up any water that spills on the floor as soon as it happens. Remove soap buildup in the tub or shower regularly. Attach bath mats securely with double-sided non-slip rug tape. Do not have throw rugs and other things on the floor that can make you trip. What can I do in the bedroom? Use night lights. Make sure that you have a light by your bed that is easy to reach. Do not use any sheets or blankets that are  too big for your bed. They should not hang down onto the floor. Have a firm chair that has side arms. You can use this for support while you get dressed. Do not have throw rugs and other things on the floor that can make you trip. What can I do in the kitchen? Clean up any spills right away. Avoid walking on wet floors. Keep items that you use a lot in easy-to-reach places. If you need to reach something above you, use a strong step stool that has a grab bar. Keep electrical cords out of the way. Do not use floor polish or wax that makes floors slippery. If you must use wax, use non-skid floor wax. Do not have throw rugs and other things on the floor that can make you trip. What can I do with my stairs? Do not leave any items on the stairs. Make sure that there are handrails on both sides of the stairs and use them. Fix handrails that are broken or loose. Make sure that handrails are as long as the stairways. Check any carpeting to make sure that it is firmly attached to the stairs. Fix any carpet that is loose or worn. Avoid having throw rugs at the top or bottom of the stairs. If you do have throw rugs, attach them to the floor with carpet tape. Make sure that you have a light switch at the top of the stairs and the bottom of the stairs. If you do not have them, ask someone to add them for you. What else can I do to help prevent falls? Wear shoes that: Do not have high heels. Have rubber bottoms. Are comfortable and fit you well. Are closed at the toe. Do not wear sandals. If you use a stepladder: Make sure that it is fully opened. Do not climb a closed stepladder. Make sure that both sides of the stepladder are locked into place. Ask someone to hold it for you, if possible. Clearly mark and make sure that you can see: Any grab bars or handrails. First and last steps. Where the edge of each step is. Use tools that help you move around (mobility aids) if they are needed. These  include: Canes. Walkers. Scooters. Crutches. Turn on the lights when you go into a dark area. Replace any light bulbs as soon as they burn out. Set up your furniture so you have a clear path. Avoid moving your furniture around. If any of your floors are uneven, fix them. If there are any pets around you, be aware of where they are. Review your medicines with your doctor. Some medicines can make you feel dizzy. This can increase your chance of falling. Ask your doctor what other things that you can do to help prevent falls. This information is not intended to replace advice given to you by your health care provider. Make sure  you discuss any questions you have with your health care provider. Document Released: 09/08/2009 Document Revised: 04/19/2016 Document Reviewed: 12/17/2014 Elsevier Interactive Patient Education  2017 [employer].

## 2024-12-01 NOTE — Progress Notes (Signed)
 "  Chief Complaint  Patient presents with   Medicare Wellness     Subjective:   Charlotte Williams is a 79 y.o. female who presents for a Medicare Annual Wellness Visit.  Visit info / Clinical Intake: Medicare Wellness Visit Type:: Subsequent Annual Wellness Visit Persons participating in visit and providing information:: patient Medicare Wellness Visit Mode:: Telephone If telephone:: video declined Since this visit was completed virtually, some vitals may be partially provided or unavailable. Missing vitals are due to the limitations of the virtual format.: Unable to obtain vitals - no equipment If Telephone or Video please confirm:: I connected with patient using audio/video enable telemedicine. I verified patient identity with two identifiers, discussed telehealth limitations, and patient agreed to proceed. Patient Location:: home Provider Location:: office Interpreter Needed?: No Pre-visit prep was completed: no AWV questionnaire completed by patient prior to visit?: no Living arrangements:: (!) lives alone Patient's Overall Health Status Rating: good Typical amount of pain: some Does pain affect daily life?: no Are you currently prescribed opioids?: no  Dietary Habits and Nutritional Risks How many meals a day?: 2 Eats fruit and vegetables daily?: yes Most meals are obtained by: preparing own meals In the last 2 weeks, have you had any of the following?: none Diabetic:: no  Functional Status Activities of Daily Living (to include ambulation/medication): Independent Ambulation: Independent Medication Administration: Independent Home Management (perform basic housework or laundry): Independent Manage your own finances?: yes Primary transportation is: driving Concerns about vision?: no *vision screening is required for WTM* Concerns about hearing?: no  Fall Screening Falls in the past year?: 0 Number of falls in past year: 0 Was there an injury with Fall?: 0 Fall Risk  Category Calculator: 0 Patient Fall Risk Level: Low Fall Risk  Fall Risk Patient at Risk for Falls Due to: No Fall Risks Fall risk Follow up: Falls evaluation completed; Education provided; Falls prevention discussed  Home and Transportation Safety: All rugs have non-skid backing?: yes All stairs or steps have railings?: (!) no Grab bars in the bathtub or shower?: yes Have non-skid surface in bathtub or shower?: (!) no Good home lighting?: yes Regular seat belt use?: yes Hospital stays in the last year:: no  Cognitive Assessment Difficulty concentrating, remembering, or making decisions? : no Will 6CIT or Mini Cog be Completed: yes What year is it?: 0 points What month is it?: 0 points Give patient an address phrase to remember (5 components): its very sunny outside today in January About what time is it?: 0 points Count backwards from 20 to 1: 0 points Say the months of the year in reverse: 0 points Repeat the address phrase from earlier: 0 points 6 CIT Score: 0 points  Advance Directives (For Healthcare) Does Patient Have a Medical Advance Directive?: Yes Does patient want to make changes to medical advance directive?: No - Patient declined Type of Advance Directive: Healthcare Power of Attorney Copy of Healthcare Power of Attorney in Chart?: No - copy requested Copy of Living Will in Chart?: No - copy requested, Physician notified  Reviewed/Updated  Reviewed/Updated: Reviewed All (Medical, Surgical, Family, Medications, Allergies, Care Teams, Patient Goals); Surgical History; Family History; Medications; Allergies; Care Teams; Patient Goals; Medical History    Allergies (verified) Codeine and Sulfa antibiotics   Current Medications (verified) Outpatient Encounter Medications as of 12/01/2024  Medication Sig   acetaminophen  (TYLENOL ) 650 MG CR tablet Take 1,300 mg by mouth 2 (two) times daily as needed for pain.   alendronate  (FOSAMAX ) 70 MG  tablet TAKE 1 TABLET (70 MG  TOTAL) BY MOUTH EVERY 7 DAYS WITH FULL GLASS WATER ON EMPTY STOMACH   Azelastine HCl 137 MCG/SPRAY SOLN Place 2 sprays into both nostrils as needed (Rhinitis).   cetirizine (ZYRTEC) 10 MG tablet Take 10 mg by mouth daily.   hydrOXYzine  (ATARAX ) 10 MG tablet Take 1 tablet (10 mg total) by mouth 2 (two) times daily as needed for itching.   latanoprost (XALATAN) 0.005 % ophthalmic solution Place 1 drop into both eyes at bedtime.   LORazepam  (ATIVAN ) 0.5 MG tablet TAKE ONE TABLET BY MOUTH AT BEDTIME AS NEEDED FOR ANXIETY OR SLEEP   metoprolol  succinate (TOPROL -XL) 25 MG 24 hr tablet Take 1 tablet (25 mg total) by mouth daily.   metoprolol  tartrate (LOPRESSOR ) 25 MG tablet TAKE 1 TABLET (25 MG TOTAL) BY MOUTH 2 (TWO) TIMES DAILY AS NEEDED. FOR PERSISTENT HEART RATE ABOVE 120 BEATS PER MINUTE AT REST   OVER THE COUNTER MEDICATION Take 2 Scoops by mouth daily at 12 noon. [place] powder   rivaroxaban  (XARELTO ) 20 MG TABS tablet Take 1 tablet (20 mg total) by mouth daily with supper.   rosuvastatin  (CRESTOR ) 5 MG tablet Take 5 mg by mouth every other day.   No facility-administered encounter medications on file as of 12/01/2024.    History: Past Medical History:  Diagnosis Date   Adenomatous colon polyp    Allergy    Aortic atherosclerosis 2023   Per CT abdomen pelvis   Arthritis    Diverticulosis    GERD (gastroesophageal reflux disease)    Hypertension    Internal hemorrhoids    Osteopenia    Past Surgical History:  Procedure Laterality Date   APPENDECTOMY     ATRIAL FIBRILLATION ABLATION N/A 10/28/2024   Procedure: ATRIAL FIBRILLATION ABLATION;  Surgeon: Kennyth Chew, MD;  Location: [facility], [place] INVASIVE CV LAB;  Service: Cardiovascular;  Laterality: N/A;   ROTATOR CUFF REPAIR Right    Bicept repair   TONSILLECTOMY AND ADENOIDECTOMY     Family History  Problem Relation Age of Onset   Endometriosis Daughter    Dementia Mother    Endometriosis Mother    Heart disease Father    Stroke Father     Arthritis Paternal Aunt    Rheum arthritis Paternal Aunt    Colon cancer Neg Hx    Esophageal cancer Neg Hx    Rectal cancer Neg Hx    Stomach cancer Neg Hx    Social History   Occupational History   Occupation: Veterinary Surgeon  Tobacco Use   Smoking status: Never   Smokeless tobacco: Never   Tobacco comments:    Never smoked 07/14/24  Vaping Use   Vaping status: Never Used  Substance and Sexual Activity   Alcohol use: Not Currently    Comment: occ   Drug use: No   Sexual activity: Never    Birth control/protection: Post-menopausal   Tobacco Counseling Counseling given: Not Answered Tobacco comments: Never smoked 07/14/24  SDOH Screenings   Food Insecurity: No Food Insecurity (12/01/2024)  Housing: Unknown (12/01/2024)  Transportation Needs: No Transportation Needs (12/01/2024)  Utilities: Not At Risk (12/01/2024)  Alcohol Screen: Low Risk (11/27/2023)  Depression (PHQ2-9): Low Risk (12/01/2024)  Financial Resource Strain: Low Risk (11/27/2023)  Physical Activity: Insufficiently Active (12/01/2024)  Social Connections: Moderately Integrated (12/01/2024)  Stress: No Stress Concern Present (12/01/2024)  Tobacco Use: Low Risk (12/01/2024)  Health Literacy: Adequate Health Literacy (12/01/2024)   See flowsheets for full screening details  Depression Screen  PHQ 2 & 9 Depression Scale- Over the past 2 weeks, how often have you been bothered by any of the following problems? Little interest or pleasure in doing things: 0 Feeling down, depressed, or hopeless (PHQ Adolescent also includes...irritable): 0 PHQ-2 Total Score: 0 Trouble falling or staying asleep, or sleeping too much: 0 Feeling tired or having little energy: 0 Poor appetite or overeating (PHQ Adolescent also includes...weight loss): 0 Feeling bad about yourself - or that you are a failure or have let yourself or your family down: 0 Trouble concentrating on things, such as reading the newspaper or watching television (PHQ Adolescent  also includes...like school work): 0 Moving or speaking so slowly that other people could have noticed. Or the opposite - being so fidgety or restless that you have been moving around a lot more than usual: 0 Thoughts that you would be better off dead, or of hurting yourself in some way: 0 PHQ-9 Total Score: 0 If you checked off any problems, how difficult have these problems made it for you to do your work, take care of things at home, or get along with other people?: Not difficult at all     Goals Addressed             This Visit's Progress    Patient Stated       Stay independent             Objective:    Today's Vitals   12/01/24 1407  Weight: 115 lb (52.2 kg)  Height: 5' 2 (1.575 m)   Body mass index is 21.03 kg/m.  Hearing/Vision screen Hearing Screening - Comments:: No trouble hearing Vision Screening - Comments:: [place] Up to date Immunizations and Health Maintenance Health Maintenance  Topic Date Due   COVID-19 Vaccine (8 - 2025-26 season) 07/27/2024   DTaP/Tdap/Td (1 - Tdap) 07/02/2025 (Originally 09/03/1965)   Medicare Annual Wellness (AWV)  12/01/2025   Pneumococcal Vaccine: 50+ Years  Completed   Influenza Vaccine  Completed   Bone Density Scan  Completed   Hepatitis C Screening  Completed   Zoster Vaccines- Shingrix  Completed   Meningococcal B Vaccine  Aged Out   Mammogram  Discontinued   Colonoscopy  Discontinued        Assessment/Plan:  This is a routine wellness examination for Coriann.  Patient Care Team: Tysinger, Alm RAMAN, PA-C as PCP - General (Family Medicine) Kennyth Chew, MD as PCP - Electrophysiology (Cardiology) Octavia Bruckner, MD as Consulting Physician (Ophthalmology) Joshua Blamer, MD as Attending Physician (Dermatology) Riddle, Suzann, NP as Nurse Practitioner (Clinical Cardiac Electrophysiology)  I have personally reviewed and noted the following in the patients chart:   Medical and social history Use of alcohol,  tobacco or illicit drugs  Current medications and supplements including opioid prescriptions. Functional ability and status Nutritional status Physical activity Advanced directives List of other physicians Hospitalizations, surgeries, and ER visits in previous 12 months Vitals Screenings to include cognitive, depression, and falls Referrals and appointments  No orders of the defined types were placed in this encounter.  In addition, I have reviewed and discussed with patient certain preventive protocols, quality metrics, and best practice recommendations. A written personalized care plan for preventive services as well as general preventive health recommendations were provided to patient.   Mliss Graff, LPN   06/28/7972   Return in 1 year (on 12/01/2025).  After Visit Summary: (MyChart) Due to this being a telephonic visit, the after visit summary with patients personalized plan was  offered to patient via MyChart   Nurse Notes:  "

## 2024-12-03 ENCOUNTER — Encounter: Payer: Medicare PPO | Admitting: Medical

## 2025-01-26 ENCOUNTER — Ambulatory Visit: Admitting: Cardiology

## 2025-02-17 ENCOUNTER — Encounter: Admitting: Medical
